# Patient Record
Sex: Female | Born: 1965 | Race: White | Hispanic: No | Marital: Married | Smoking: Never smoker
Health system: Southern US, Community
[De-identification: ages and names within clinical notes are randomized; demographics above are authoritative.]

## PROBLEM LIST (undated history)

## (undated) DIAGNOSIS — I4891 Unspecified atrial fibrillation: Secondary | ICD-10-CM

## (undated) DIAGNOSIS — J302 Other seasonal allergic rhinitis: Secondary | ICD-10-CM

## (undated) DIAGNOSIS — K219 Gastro-esophageal reflux disease without esophagitis: Secondary | ICD-10-CM

## (undated) DIAGNOSIS — D649 Anemia, unspecified: Secondary | ICD-10-CM

## (undated) DIAGNOSIS — M1990 Unspecified osteoarthritis, unspecified site: Secondary | ICD-10-CM

## (undated) DIAGNOSIS — E049 Nontoxic goiter, unspecified: Secondary | ICD-10-CM

## (undated) DIAGNOSIS — R0683 Snoring: Secondary | ICD-10-CM

## (undated) DIAGNOSIS — E7800 Pure hypercholesterolemia, unspecified: Secondary | ICD-10-CM

## (undated) DIAGNOSIS — I10 Essential (primary) hypertension: Secondary | ICD-10-CM

## (undated) DIAGNOSIS — R0600 Dyspnea, unspecified: Secondary | ICD-10-CM

## (undated) HISTORY — PX: BREAST SURGERY: SHX581

## (undated) HISTORY — DX: Snoring: R06.83

## (undated) HISTORY — PX: WISDOM TOOTH EXTRACTION: SHX21

## (undated) HISTORY — PX: AUGMENTATION MAMMAPLASTY: SUR837

## (undated) HISTORY — PX: OTHER SURGICAL HISTORY: SHX169

## (undated) HISTORY — PX: COLONOSCOPY: SHX174

---

## 2012-06-27 ENCOUNTER — Other Ambulatory Visit (HOSPITAL_COMMUNITY)
Admission: RE | Admit: 2012-06-27 | Discharge: 2012-06-27 | Disposition: A | Discharge status: Home / Self Care | Payer: BC Managed Care – PPO | Source: Ambulatory Visit | Attending: Family Medicine | Admitting: Family Medicine

## 2012-06-27 DIAGNOSIS — Z1151 Encounter for screening for human papillomavirus (HPV): Secondary | ICD-10-CM | POA: Insufficient documentation

## 2012-06-27 DIAGNOSIS — Z0000 Encounter for general adult medical examination without abnormal findings: Secondary | ICD-10-CM | POA: Insufficient documentation

## 2012-08-13 ENCOUNTER — Other Ambulatory Visit: Payer: Self-pay

## 2012-08-13 DIAGNOSIS — Z1231 Encounter for screening mammogram for malignant neoplasm of breast: Secondary | ICD-10-CM

## 2012-09-04 ENCOUNTER — Ambulatory Visit
Admission: RE | Admit: 2012-09-04 | Discharge: 2012-09-04 | Disposition: A | Discharge status: Home / Self Care | Payer: BC Managed Care – PPO | Source: Ambulatory Visit

## 2012-09-04 DIAGNOSIS — Z1231 Encounter for screening mammogram for malignant neoplasm of breast: Secondary | ICD-10-CM

## 2012-09-15 ENCOUNTER — Other Ambulatory Visit: Payer: Self-pay | Admitting: Family Medicine

## 2012-09-15 DIAGNOSIS — R928 Other abnormal and inconclusive findings on diagnostic imaging of breast: Secondary | ICD-10-CM

## 2012-09-24 ENCOUNTER — Ambulatory Visit
Admission: RE | Admit: 2012-09-24 | Discharge: 2012-09-24 | Disposition: A | Discharge status: Home / Self Care | Payer: BC Managed Care – PPO | Source: Ambulatory Visit | Attending: Family Medicine | Admitting: Family Medicine

## 2012-09-24 DIAGNOSIS — R928 Other abnormal and inconclusive findings on diagnostic imaging of breast: Secondary | ICD-10-CM

## 2012-10-03 ENCOUNTER — Other Ambulatory Visit: Payer: BC Managed Care – PPO

## 2012-12-10 ENCOUNTER — Encounter (HOSPITAL_COMMUNITY): Payer: Self-pay | Admitting: Emergency Medicine

## 2012-12-10 ENCOUNTER — Emergency Department (HOSPITAL_COMMUNITY)
Admission: EM | Admit: 2012-12-10 | Discharge: 2012-12-10 | Disposition: A | Discharge status: Home / Self Care | Payer: BC Managed Care – PPO | Attending: Emergency Medicine | Admitting: Emergency Medicine

## 2012-12-10 ENCOUNTER — Emergency Department (HOSPITAL_COMMUNITY): Payer: BC Managed Care – PPO

## 2012-12-10 DIAGNOSIS — I4891 Unspecified atrial fibrillation: Secondary | ICD-10-CM

## 2012-12-10 DIAGNOSIS — I480 Paroxysmal atrial fibrillation: Secondary | ICD-10-CM

## 2012-12-10 DIAGNOSIS — Z3202 Encounter for pregnancy test, result negative: Secondary | ICD-10-CM | POA: Insufficient documentation

## 2012-12-10 DIAGNOSIS — R000 Tachycardia, unspecified: Secondary | ICD-10-CM | POA: Insufficient documentation

## 2012-12-10 DIAGNOSIS — Z862 Personal history of diseases of the blood and blood-forming organs and certain disorders involving the immune mechanism: Secondary | ICD-10-CM | POA: Insufficient documentation

## 2012-12-10 DIAGNOSIS — R42 Dizziness and giddiness: Secondary | ICD-10-CM | POA: Insufficient documentation

## 2012-12-10 DIAGNOSIS — R0602 Shortness of breath: Secondary | ICD-10-CM | POA: Insufficient documentation

## 2012-12-10 DIAGNOSIS — Z8639 Personal history of other endocrine, nutritional and metabolic disease: Secondary | ICD-10-CM | POA: Insufficient documentation

## 2012-12-10 DIAGNOSIS — Z8679 Personal history of other diseases of the circulatory system: Secondary | ICD-10-CM | POA: Insufficient documentation

## 2012-12-10 DIAGNOSIS — R110 Nausea: Secondary | ICD-10-CM | POA: Insufficient documentation

## 2012-12-10 HISTORY — DX: Unspecified atrial fibrillation: I48.91

## 2012-12-10 HISTORY — DX: Other seasonal allergic rhinitis: J30.2

## 2012-12-10 HISTORY — DX: Pure hypercholesterolemia, unspecified: E78.00

## 2012-12-10 HISTORY — DX: Anemia, unspecified: D64.9

## 2012-12-10 HISTORY — DX: Essential (primary) hypertension: I10

## 2012-12-10 LAB — POCT I-STAT TROPONIN I: Troponin i, poc: 0 ng/mL (ref 0.00–0.08)

## 2012-12-10 LAB — BASIC METABOLIC PANEL
BUN: 10 mg/dL (ref 6–23)
CO2: 25 mEq/L (ref 19–32)
GFR calc Af Amer: 86 mL/min — ABNORMAL LOW (ref >90)
GFR calc non Af Amer: 74 mL/min — ABNORMAL LOW (ref >90)
Potassium: 4.3 mEq/L (ref 3.5–5.1)
Sodium: 140 mEq/L (ref 135–145)

## 2012-12-10 LAB — MAGNESIUM: Magnesium: 1.9 mg/dL (ref 1.5–2.5)

## 2012-12-10 LAB — TSH: TSH: 1.224 u[IU]/mL (ref 0.350–4.500)

## 2012-12-10 LAB — CBC
HCT: 40.7 % (ref 36.0–46.0)
MCHC: 33.9 g/dL (ref 30.0–36.0)
MCV: 87.9 fL (ref 78.0–100.0)
Platelets: 331 10*3/uL (ref 150–400)
RDW: 12.8 % (ref 11.5–15.5)

## 2012-12-10 LAB — T4, FREE: Free T4: 1.12 ng/dL (ref 0.80–1.80)

## 2012-12-10 MED ORDER — ASPIRIN EC 81 MG PO TBEC: 81.0000 mg | DELAYED_RELEASE_TABLET | Freq: Every day | ORAL | Status: DC

## 2012-12-10 MED ORDER — DILTIAZEM HCL ER COATED BEADS 120 MG PO CP24: 120.0000 mg | ORAL_CAPSULE | Freq: Every day | ORAL | Status: DC

## 2012-12-10 MED ORDER — DILTIAZEM HCL 25 MG/5ML IV SOLN
20.0000 mg | Freq: Once | INTRAVENOUS | Status: AC
  Administered 2012-12-10: 20 mg via INTRAVENOUS
  Filled 2012-12-10: qty 5

## 2012-12-10 MED ORDER — WARFARIN SODIUM 5 MG PO TABS: 5.0000 mg | ORAL_TABLET | Freq: Every day | ORAL | Status: DC

## 2012-12-10 MED ORDER — PERFLUTREN LIPID MICROSPHERE
1.0000 mL | INTRAVENOUS | Status: DC | PRN
  Administered 2012-12-10: 2 mL via INTRAVENOUS
  Filled 2012-12-10: qty 10

## 2012-12-10 NOTE — H&P (Signed)
Cardiology Consultation Note  Patient ID: Elaine [name], MRN: [id number], DOB/AGE: [birth year] 47 y.o. Admit date: 12/10/2012   Date of Consult: 12/10/2012 Primary Physician: No primary provider on file. Primary Cardiologist: New, being seen by Dr.Jaxson Anglin - lives in [place] - saw Dr. Noralyn Pick in 2011 for palpitations  Chief Complaint: palpitations Reason for Consult: atrial fibrillation  HPI: Ms. 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 She was transferred to [facility] for further evaluation where she was given 20mg  IV diltiazem with subsequent conversion to NSR. She had cardiac eval in 2011 at [facility] for intermittent palpitations at which time she reports event monitoring was unremarkable except for a few "blips." CareEverywhere shows a normal echo at that time.  She reports increasing frequency of tachypalps over the last 3-4 months. This morning she woke up, rolled over on her left side, and the palpitations started. This was associated with a feeling of uncomfortableness in her chest but no overt CP. She made an appointment with her PCP and did note that she was more SOB walking up to the doctor's office. She does not exercise, but denies any exertional CP or SOB recently. No nausea, syncope, stroke history, bleeding history, recent long travel or personal history of blood clot in the past. When she converted to NSR she felt well again. CBC/BMET unremarkable. LMP 10/01/12 but patient denies that she could be pregnant and reports that she has been having irregular periods since entering perimenopause this past year. At home, BP typically runs 130's systolic at home but occasional readings of 150-160, diastolic usually running upper 80s-90s. At the MD office and ER today it was consistently elevated up to peak 169/95. It has  come down to 136/90 with diltiazem. Denies orthopnea. Endorses gradual weight gain of 20lbs over 2 yrs she feels due to eating habits, less activity and transition to more sedentary job. No acute weight changes however. Last dose of Imitrex last month. Cut down on caffeine after instructed to in 2011.  Past Medical History  Diagnosis Date  . Migraines   . Hypercholesterolemia     Borderline - not requiring medicines  . Seasonal allergies   . Anemia     In past - borderline anemia requiring iron      Most Recent Cardiac Studies: 2D Echo 08/2009:  INTERPRETATION(S)  1. LVEF >55%.  2. No significant valvular disease.  AORTIC ROOT:  Size: 3.0  Dissection: indeterminate for dissection   LEFT VENTRICLE LV WALL MOTION  Size: normal Normal in limited views  Diastolic Filling: normal  Estimated EF: >55%  LV masses: none  LVH: none  LV dimensions:  IVSd: 0.8 LVIDd: 4.0 LVPWd: 0.8 LVIDs: 2.2   RIGHT VENTRICLE  Size: normal RV free wall: normal  Contraction normal RV masses: none   LEFT ATRIUM  Size: normal Diameter: 3.2  LA Masses: none Area (cm2): 18.3   RIGHT ATRIUM  Size: normal RA other: none  RA Masses: none   PERICARDIUM  Fluid: none   AORTIC VALVE MITRAL VALVE  Leaflets: normal Leaflets: normal  Morphology: tri-leaflet Morphology: normal  Mobility: normal Mobility: normal   TRICUSPID VALVE PULMONIC VALVE  Leaflets: normal Not well seen  Morphology: normal  Mobility: normal   DOPPLER ECHO WITH SPECTRAL AND COLOR FLOW VELOCITY  Regurgitation Stenosis  Aortic none none  Mitral none none  Tricuspid none none TR Velocity = 1.6 m/s  Pulmonic none none Estimated RVSP = 21 mmHg      Surgical History:  Past Surgical History  Procedure Laterality Date  . Breast surgery    . Egg donation      In her 20's.     Home Meds: Prior to Admission medications   Medication Sig Start Date End Date Taking? Authorizing Provider  carboxymethylcellulose (REFRESH PLUS) 0.5  % SOLN Place 2 drops into both eyes 3 (three) times daily as needed.   Yes Historical Provider, MD  cholecalciferol (VITAMIN D) 1000 UNITS tablet Take 1,000 Units by mouth daily.   Yes Historical Provider, MD  ibuprofen (ADVIL,MOTRIN) 200 MG tablet Take 600 mg by mouth daily as needed.   Yes Historical Provider, MD  naproxen (NAPROSYN) 250 MG tablet Take 250 mg by mouth daily as needed for mild pain.   Yes Historical Provider, MD  Omega-3 Fatty Acids (FISH OIL) 1000 MG CAPS Take 2,000 mg by mouth daily.   Yes Historical Provider, MD  OVER THE COUNTER MEDICATION Take 3 tablets by mouth 2 (two) times daily with a meal. Macafem   Yes Historical Provider, MD  SUMAtriptan (IMITREX) 25 MG tablet Take 25 mg by mouth once. May repeat in 2 hours if headache persists or recurs.   Yes Historical Provider, MD    Inpatient Medications:     Allergies:  Allergies  Allergen Reactions  . Other Other (See Comments)    Raw Almonds, itching   Peaches, itching  . Ampicillin Rash    Has taken amoxicillin without any reaction    History   Social History  . Marital Status: Divorced    Spouse Name: N/A    Number of Children: N/A  . Years of Education: N/A   Occupational History  .      Nurse   Social History Main Topics  . Smoking status: Never Smoker   . Smokeless tobacco: Not on file     Comment: Secondhand smoke up to age 36  . Alcohol Use: Yes     Comment: Occasional glass of wine on weekend or with dinner  . Drug Use: No  . Sexual Activity: Not on file   Other Topics Concern  . Not on file   Social History Narrative  . No narrative on file     Family History  Problem Relation Age of Onset  . Atrial fibrillation Father   . Atrial fibrillation      Aunt  . Stroke Mother   . CAD      Paternal uncle CABG age 26  . Migraines Sister   . Aneurysm Mother     Also had blood clot after sister was born  . Peripheral vascular disease Mother     In her 72's     Review of  Systems: General: negative for chills, fever, Cardiovascular: see above- occ mild LEE around time of menstrual cycle or eating salt Dermatological: negative for rash Respiratory: negative for cough or wheezing Urologic: negative for hematuria Abdominal: negative for nausea, vomiting, diarrhea, bright red blood per rectum, melena, or hematemesis Neurologic: negative for visual changes, syncope, or dizziness All other systems reviewed and are otherwise negative except as noted above.  Labs: Troponin neg x 1 Lab Results  Component Value Date   WBC 7.3 12/10/2012   HGB 13.8 12/10/2012   HCT 40.7 12/10/2012   MCV 87.9 12/10/2012   PLT 331 12/10/2012     Recent  Labs Lab 12/10/12 1136  NA 140  K 4.3  CL 105  CO2 25  BUN 10  CREATININE 0.91  CALCIUM 9.7  GLUCOSE 89   Radiology/Studies:  Dg Chest 2 View 12/10/2012   CLINICAL DATA:  Atrial fibrillation this morning. No other complaints.  EXAM: CHEST  2 VIEW  COMPARISON:  None.  FINDINGS: The heart size and mediastinal contours are within normal limits. Both lungs are clear. The visualized skeletal structures are unremarkable.  IMPRESSION: No active cardiopulmonary disease.   Electronically Signed   By: Elige Ko   On: 12/10/2012 12:03   EKG: atrial fibrillation RVR 130bpm, nonspecific ST-T changes  Physical Exam: Blood pressure 136/90, temperature 98.3 F (36.8 C), temperature source Oral, resp. rate 17, height 5\' 4"  (1.626 m), weight 193 lb (87.544 kg), last menstrual period 10/01/2012, SpO2 99.00%. General: Well developed, well appearing WF in no acute distress. Head: Normocephalic, atraumatic, sclera non-icteric, no xanthomas, nares are without discharge.  Neck: Negative for carotid bruits. JVD not elevated. Lungs: Clear bilaterally to auscultation without wheezes, rales, or rhonchi. Breathing is unlabored. Heart: RRR with S1 S2. No murmurs, rubs, or gallops appreciated. Abdomen: Soft, non-tender, non-distended with  normoactive bowel sounds. No hepatomegaly. No rebound/guarding. No obvious abdominal masses. Msk:  Strength and tone appear normal for age. Extremities: No clubbing or cyanosis. No edema.  Distal pedal pulses are 2+ and equal bilaterally. Neuro: Alert and oriented X 3. No facial asymmetry. No focal deficit. Moves all extremities spontaneously. Psych:  Responds to questions appropriately with a normal affect.   Assessment and Plan:   1. Atrial fibrillation, suspected paroxysmal 2. Borderline elevated BP, likely mild hypertension 3. H/o migraines, on PRN imitrex 4. Perimenopause  CHADSVASC score is felt to be 2 for female and also mild HTN, thus she should be considered for anticoagulation. It would be devastating in this otherwise healthy woman to sustain a stroke in the absence of any contraindications to blood thinners. Per discussion with the patient and Dr. Patty Sermons, will begin Coumadin 5mg  daily with INR check in our office on Friday. She does not want to be on a NOAC at this time due to lack of reversiblility. Start Diltiazem CD 120mg  daily. I e-prescribed these. Dr. Patty Sermons would like her to take Aspirin 81mg  daily until INR is therapeutic. She was also given a Coumadin handout attachment to her DC paperwork.  Check urine hCG for completeness since we are beginning Coumadin.  Will send off TSH, free T4, magnesium. She does not need to wait for these results. Echo will be done in ER prior to discharge.  Signed, Ronie Spies PA-C 12/10/2012, 2:22 PM Seen in ER with Ronie Spies, PA-C.  EKG from earlier today reviewed and shows atrial fibrillation with rapid ventricular response. Her CHADSVASC score is 2 for hypertension and female sex. She prefers warfarin to NOAC and will be able to follow up in [facility] coumadin clinic. Her physical exam is unrevealing. Agree with assessment and plan as noted above. Will try to get echo done while she is here in ER. Will add diltiazem CD 120 daily for BP  and future rate control.

## 2012-12-10 NOTE — ED Provider Notes (Signed)
CSN: [id number]     Arrival date & time 12/10/12  1051 History   First MD Initiated Contact with Patient 12/10/12 1052     Chief Complaint  Patient presents with  . Atrial Fibrillation   (Consider location/radiation/quality/duration/timing/severity/associated sxs/prior Treatment) HPI Comments: Patient is a 47 year old female with past medical history of migraines and high cholesterol who presents to the emergency department via EMS from her primary care physician's office with new onset atrial fibrillation. Patient states she woke up this morning and felt as if her heart was racing and "skipping beats" with associated dizziness and shortness of breath when she walks down the stairs. States it feels as if she has anxiety, however without the anxiousness. At the time she was slightly nauseated. She went to her PCP, had an EKG which showed atrial fibrillation and RVR. States she has had episodes like this in the past, however symptoms have not lasted as long as today. 4 years ago she saw a cardiologist at [facility], wore a 24-hour Holter monitor and had an echo which were both normal. She has not seen a cardiologist since. Denies chest pain. EKG from EMS showed atrial fibrillation with a rate of 130.   Patient is a 47 y.o. female presenting with atrial fibrillation. The history is provided by the patient.  Atrial Fibrillation Associated symptoms include nausea.    Past Medical History  Diagnosis Date  . Migraines   . Hypercholesterolemia    Past Surgical History  Procedure Laterality Date  . Breast surgery     No family history on file. History  Substance Use Topics  . Smoking status: Never Smoker   . Smokeless tobacco: Not on file  . Alcohol Use: No   OB History   Grav Para Term Preterm Abortions TAB SAB Ect Mult Living                 Review of Systems  Cardiovascular: Positive for palpitations.  Gastrointestinal: Positive for nausea.  Neurological: Positive for dizziness.     Allergies  Review of patient's allergies indicates not on file.  Home Medications  No current outpatient prescriptions on file. BP 169/95  Temp(Src) 98.2 F (36.8 C) (Oral)  Resp 20  Ht 5\' 4"  (1.626 m)  Wt 193 lb (87.544 kg)  BMI 33.11 kg/m2  SpO2 98%  LMP 10/01/2012 Physical Exam  Nursing note and vitals reviewed. Constitutional: She is oriented to person, place, and time. She appears well-developed and well-nourished. No distress.  HENT:  Head: Normocephalic and atraumatic.  Mouth/Throat: Oropharynx is clear and moist.  Eyes: Conjunctivae and EOM are normal. Pupils are equal, round, and reactive to light.  Neck: Normal range of motion. Neck supple.  Cardiovascular: Normal heart sounds and intact distal pulses.  An irregularly irregular rhythm present. Tachycardia present.   Pulmonary/Chest: Effort normal and breath sounds normal.  Abdominal: Soft. Bowel sounds are normal. There is no tenderness.  Musculoskeletal: Normal range of motion. She exhibits no edema.  Neurological: She is alert and oriented to person, place, and time. She has normal strength. No sensory deficit.  Skin: Skin is warm and dry. She is not diaphoretic.  Psychiatric: She has a normal mood and affect. Her behavior is normal.    ED Course  Procedures (including critical care time) Labs Review Labs Reviewed  CBC  BASIC METABOLIC PANEL   Imaging Review No results found.  EKG Interpretation   None      Date: 12/10/2012  Rate: 130  Rhythm: atrial  fibrillation  QRS Axis: normal  Intervals: normal  ST/T Wave abnormalities: normal  Conduction Disutrbances:none  Narrative Interpretation: afib, anterior infarct (old)  Old EKG Reviewed: none available    MDM   1. PAF (paroxysmal atrial fibrillation)     Patient with new onset atrial fibrillation. She is well appearing and in no apparent distress. Tachycardic with an irregularly irregular rhythm on exam. EKG showing atrial fibrillation.  Will give Cardizem. Labs pending- cbc, bmp, troponin. CXR. Plan to consult cardiology. 11:57 AM Patient now in sinus rhythm with rate of 94. Her symptoms have resolved. 2:56 PM I spoke with Ward Givens, NP with cardiology who had PA come down to see patient. She spoke with her attending Dr. Patty Sermons who suggested starting Warfarin and Diltiazem, obtaining echo in ED. Medications prescribed by cards PA. Echo pending, once complete, d/c home.  Pt to be discharged home after echo by Dr. Manus Gunning.  Trevor Mace, PA-C 12/11/12 [phone]

## 2012-12-10 NOTE — Progress Notes (Signed)
The patient was given the following info prior to leaving ED:  Elaine [name] 12/10/12  Coumadin clinic appointment: Friday 12/12/12 at 2:30pm Followup with Dr. Patty Sermons: 12/19/12 at 3:30pm [facility] [address] [place], [place] [address]   New medicines: Diltiazem CD 120mg  by mouth once daily Coumadin 5mg  by mouth once daily for now (Coumadin clinic will tell you if your dose needs adjusting) Take Aspirin 81mg  by mouth daily until INR is therapeutic between 2-3, then discontinue aspirin

## 2012-12-10 NOTE — Progress Notes (Signed)
Echocardiogram 2D Echocardiogram with Definity has been performed.  Riordan Walle 12/10/2012, 4:20 PM

## 2012-12-10 NOTE — ED Notes (Signed)
Pt woke up this am and her heart was skipping beats.  Mild dyspnea with exertion.  Dizziness and nausea mild this am.  PCP did a EKG and revealed RVR with A Fib.  Last EKG strip for EMS read A Fib, rate of 130.  Pt alert and oriented.  Walked from EMS stretcher to bed in ED.

## 2012-12-11 NOTE — ED Provider Notes (Addendum)
Medical screening examination/treatment/procedure(s) were performed by non-physician practitioner and as supervising physician I was immediately available for consultation/collaboration.  EKG Interpretation    Date/Time:  Wednesday December 10 2012 11:00:39 EST Ventricular Rate:  130 PR Interval:    QRS Duration: 81 QT Interval:  300 QTC Calculation: 441 R Axis:   63 Text Interpretation:  Atrial fibrillation Anterior infarct, old ED PHYSICIAN INTERPRETATION AVAILABLE IN [facility] Confirmed by TEST, RECORD ([id number]) on 12/12/2012 9:12:26 AM               Layla Maw Bexley Mclester, DO 12/11/12 0708  Layla Maw Ryo Klang, DO 12/17/12 [id number]

## 2012-12-12 ENCOUNTER — Ambulatory Visit (INDEPENDENT_AMBULATORY_CARE_PROVIDER_SITE_OTHER): Payer: BC Managed Care – PPO | Admitting: Pharmacist

## 2012-12-12 DIAGNOSIS — Z7901 Long term (current) use of anticoagulants: Secondary | ICD-10-CM

## 2012-12-12 DIAGNOSIS — I4891 Unspecified atrial fibrillation: Secondary | ICD-10-CM

## 2012-12-12 LAB — POCT INR: INR: 1

## 2012-12-17 ENCOUNTER — Encounter: Payer: Self-pay | Admitting: *Deleted

## 2012-12-19 ENCOUNTER — Encounter: Payer: Self-pay | Admitting: Cardiology

## 2012-12-19 ENCOUNTER — Ambulatory Visit (INDEPENDENT_AMBULATORY_CARE_PROVIDER_SITE_OTHER): Payer: BC Managed Care – PPO | Admitting: Cardiology

## 2012-12-19 VITALS — BP 132/88 | HR 69 | Ht 64.0 in | Wt 195.0 lb

## 2012-12-19 DIAGNOSIS — I119 Hypertensive heart disease without heart failure: Secondary | ICD-10-CM | POA: Insufficient documentation

## 2012-12-19 DIAGNOSIS — I4891 Unspecified atrial fibrillation: Secondary | ICD-10-CM

## 2012-12-19 DIAGNOSIS — Z7901 Long term (current) use of anticoagulants: Secondary | ICD-10-CM

## 2012-12-19 MED ORDER — DILTIAZEM HCL ER COATED BEADS 120 MG PO CP24: 120.0000 mg | ORAL_CAPSULE | Freq: Every day | ORAL | Status: DC

## 2012-12-19 NOTE — Assessment & Plan Note (Signed)
No further episodes of atrial fibrillation.

## 2012-12-19 NOTE — Addendum Note (Signed)
Addended by: Regis Bill B on: 12/19/2012 05:19 PM   Modules accepted: Orders

## 2012-12-19 NOTE — Progress Notes (Signed)
Elaine [name] Date of Birth:  07-16-1965 [address] [place], [place]  [address] [phone]         Fax   [phone]  History of Present Illness: This pleasant 47 year old woman returns for a post hospital office visit.  She had been seen in [facility] emergency room last week with paroxysmal atrial fibrillation.  She converted on IV diltiazem.  She gave a past history of tachycardia palpitations.  She had an evaluation at [facility] in 2001 for intermittent palpitations.  Her echocardiogram at that time was normal. The patient was seen in the emergency room at [facility] on 12/10/12.  She was noted to be mildly hypertensive and her Chadsvasc score was 2 for hypertension and female sex.  She was started on warfarin.  She preferred warfarin to one of the NOAC drugs because of its reversibility. She had an echocardiogram done prior to leaving the emergency room on 12/10/12 and the echocardiogram was normal with left ventricular ejection fraction of 60-65% and no valvular lesions.  She was discharged from the emergency room on diltiazem CD 120 once a day and on Coumadin. Since discharge she has had one or 2 very brief episodes of palpitations but nothing sustained.  No chest pain or shortness of breath.  No migraine headaches.  Current Outpatient Prescriptions  Medication Sig Dispense Refill  . aspirin EC 81 MG tablet Take 1 tablet (81 mg total) by mouth daily. Until INR is therapeutic between 2-3.      . carboxymethylcellulose (REFRESH PLUS) 0.5 % SOLN Place 2 drops into both eyes 3 (three) times daily as needed.      . cholecalciferol (VITAMIN D) 1000 UNITS tablet Take 1,000 Units by mouth daily.      Marland Kitchen diltiazem (CARDIZEM CD) 120 MG 24 hr capsule Take 1 capsule (120 mg total) by mouth daily.  30 capsule  3  . Flaxseed, Linseed, (FLAX SEEDS) POWD Take by mouth as directed.      . Multiple Vitamins-Minerals (CENTRUM ULTRA WOMENS) TABS Take by mouth as directed.      .  Omega-3 Fatty Acids (FISH OIL) 1000 MG CAPS Take 2,000 mg by mouth daily.      Marland Kitchen OVER THE COUNTER MEDICATION Take by mouth. Macafem      . SUMAtriptan (IMITREX) 25 MG tablet Take 25 mg by mouth once. May repeat in 2 hours if headache persists or recurs.      . warfarin (COUMADIN) 5 MG tablet Take 1 tablet (5 mg total) by mouth daily. Or as directed.  60 tablet  3   No current facility-administered medications for this visit.    Allergies  Allergen Reactions  . Other Other (See Comments)    Raw Almonds, itching   Peaches, itching  . Ampicillin Rash    Has taken amoxicillin without any reaction    Patient Active Problem List   Diagnosis Date Noted  . Atrial fibrillation 12/12/2012  . Long term (current) use of anticoagulants 12/12/2012    History  Smoking status  . Never Smoker   Smokeless tobacco  . Not on file    Comment: Secondhand smoke up to age 48    History  Alcohol Use  . Yes    Comment: Occasional glass of wine on weekend or with dinner    Family History  Problem Relation Age of Onset  . Atrial fibrillation Father   . Atrial fibrillation      Aunt  .  Stroke Mother   . CAD Paternal Uncle     CABG age 47  . Migraines Sister   . Aneurysm Mother     Also had blood clot after sister was born  . Peripheral vascular disease Mother     In her 68's    Review of Systems: Constitutional: no fever chills diaphoresis or fatigue or change in weight.  Head and neck: no hearing loss, no epistaxis, no photophobia or visual disturbance. Respiratory: No cough, shortness of breath or wheezing. Cardiovascular: No chest pain peripheral edema, palpitations. Gastrointestinal: No abdominal distention, no abdominal pain, no change in bowel habits hematochezia or melena. Genitourinary: No dysuria, no frequency, no urgency, no nocturia. Musculoskeletal:No arthralgias, no back pain, no gait disturbance or myalgias. Neurological: No dizziness, no headaches, no numbness, no  seizures, no syncope, no weakness, no tremors. Hematologic: No lymphadenopathy, no easy bruising. Psychiatric: No confusion, no hallucinations, no sleep disturbance.    Physical Exam: Filed Vitals:   12/19/12 1527  BP: 132/88  Pulse: 69   the general appearance reveals a well-developed well-nourished woman in no distress.  Her husband is with her today.The head and neck exam reveals pupils equal and reactive.  Extraocular movements are full.  There is no scleral icterus.  The mouth and pharynx are normal.  The neck is supple.  The carotids reveal no bruits.  The jugular venous pressure is normal.  The  thyroid is not enlarged.  There is no lymphadenopathy.  The chest is clear to percussion and auscultation.  There are no rales or rhonchi.  Expansion of the chest is symmetrical.  The precordium is quiet.  The first heart sound is normal.  The second heart sound is physiologically split.  There is no murmur gallop rub or click.  There is no abnormal lift or heave.  The abdomen is soft and nontender.  The bowel sounds are normal.  The liver and spleen are not enlarged.  There are no abdominal masses.  There are no abdominal bruits.  Extremities reveal good pedal pulses.  There is no phlebitis or edema.  There is no cyanosis or clubbing.  Strength is normal and symmetrical in all extremities.  There is no lateralizing weakness.  There are no sensory deficits.  The skin is warm and dry.  There is no rash.  EKG today shows normal sinus rhythm and no ischemic changes.  Assessment / Plan: Continue on same medication.  Recheck in 3 months for followup office visit and EKG.  Work harder on weight loss which will help diastolic blood pressure.

## 2012-12-19 NOTE — Assessment & Plan Note (Signed)
Diastolic blood pressure today is still borderline high.  We will continue same dose of diltiazem but she will work harder on weight loss and salt restriction.  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

## 2012-12-19 NOTE — Assessment & Plan Note (Signed)
The Coumadin clinic was closed this afternoon so we drew a prothrombin time through venous blood and we will call her with the results.

## 2012-12-19 NOTE — Patient Instructions (Signed)
Will obtain labs today and call you with the results (PT/INR)  Your physician recommends that you continue on your current medications as directed. Please refer to the Current Medication list given to you today.  Your physician recommends that you schedule a follow-up appointment in: 3 month ov/ekg

## 2012-12-22 ENCOUNTER — Ambulatory Visit (INDEPENDENT_AMBULATORY_CARE_PROVIDER_SITE_OTHER): Payer: BC Managed Care – PPO | Admitting: Internal Medicine

## 2012-12-22 DIAGNOSIS — I4891 Unspecified atrial fibrillation: Secondary | ICD-10-CM

## 2012-12-22 DIAGNOSIS — Z7901 Long term (current) use of anticoagulants: Secondary | ICD-10-CM

## 2012-12-26 ENCOUNTER — Ambulatory Visit (INDEPENDENT_AMBULATORY_CARE_PROVIDER_SITE_OTHER): Payer: BC Managed Care – PPO | Admitting: *Deleted

## 2012-12-26 DIAGNOSIS — Z7901 Long term (current) use of anticoagulants: Secondary | ICD-10-CM

## 2012-12-26 DIAGNOSIS — I4891 Unspecified atrial fibrillation: Secondary | ICD-10-CM

## 2012-12-26 LAB — POCT INR: INR: 1.3

## 2012-12-31 ENCOUNTER — Ambulatory Visit (INDEPENDENT_AMBULATORY_CARE_PROVIDER_SITE_OTHER): Payer: BC Managed Care – PPO | Admitting: *Deleted

## 2012-12-31 DIAGNOSIS — I4891 Unspecified atrial fibrillation: Secondary | ICD-10-CM

## 2012-12-31 DIAGNOSIS — Z7901 Long term (current) use of anticoagulants: Secondary | ICD-10-CM

## 2012-12-31 LAB — POCT INR: INR: 1.5

## 2013-01-07 ENCOUNTER — Ambulatory Visit (INDEPENDENT_AMBULATORY_CARE_PROVIDER_SITE_OTHER): Payer: BC Managed Care – PPO | Admitting: *Deleted

## 2013-01-07 DIAGNOSIS — Z7901 Long term (current) use of anticoagulants: Secondary | ICD-10-CM

## 2013-01-07 DIAGNOSIS — I4891 Unspecified atrial fibrillation: Secondary | ICD-10-CM

## 2013-01-21 ENCOUNTER — Ambulatory Visit (INDEPENDENT_AMBULATORY_CARE_PROVIDER_SITE_OTHER): Payer: BC Managed Care – PPO | Admitting: Pharmacist

## 2013-01-21 DIAGNOSIS — Z7901 Long term (current) use of anticoagulants: Secondary | ICD-10-CM

## 2013-01-21 DIAGNOSIS — I4891 Unspecified atrial fibrillation: Secondary | ICD-10-CM

## 2013-02-11 ENCOUNTER — Ambulatory Visit (INDEPENDENT_AMBULATORY_CARE_PROVIDER_SITE_OTHER): Payer: BC Managed Care – PPO | Admitting: *Deleted

## 2013-02-11 DIAGNOSIS — Z7901 Long term (current) use of anticoagulants: Secondary | ICD-10-CM

## 2013-02-11 DIAGNOSIS — I4891 Unspecified atrial fibrillation: Secondary | ICD-10-CM

## 2013-02-11 LAB — POCT INR: INR: 1.8

## 2013-02-25 ENCOUNTER — Ambulatory Visit (INDEPENDENT_AMBULATORY_CARE_PROVIDER_SITE_OTHER): Payer: BC Managed Care – PPO | Admitting: *Deleted

## 2013-02-25 DIAGNOSIS — Z7901 Long term (current) use of anticoagulants: Secondary | ICD-10-CM

## 2013-02-25 DIAGNOSIS — Z5181 Encounter for therapeutic drug level monitoring: Secondary | ICD-10-CM | POA: Insufficient documentation

## 2013-02-25 DIAGNOSIS — I4891 Unspecified atrial fibrillation: Secondary | ICD-10-CM

## 2013-02-25 LAB — POCT INR: INR: 2

## 2013-03-18 ENCOUNTER — Ambulatory Visit: Payer: BC Managed Care – PPO | Admitting: Cardiology

## 2013-03-26 ENCOUNTER — Other Ambulatory Visit: Payer: Self-pay | Admitting: *Deleted

## 2013-03-26 ENCOUNTER — Telehealth: Payer: Self-pay | Admitting: *Deleted

## 2013-03-26 DIAGNOSIS — I4891 Unspecified atrial fibrillation: Secondary | ICD-10-CM

## 2013-03-26 MED ORDER — DILTIAZEM HCL ER COATED BEADS 120 MG PO CP24: 120.0000 mg | ORAL_CAPSULE | Freq: Every day | ORAL | Status: DC

## 2013-03-26 MED ORDER — WARFARIN SODIUM 5 MG PO TABS: ORAL_TABLET | ORAL | Status: DC

## 2013-03-26 NOTE — Telephone Encounter (Signed)
Patient requests coumadin refill to be sent to [facility] on [address]. Thanks, Elaine [name]

## 2013-03-30 ENCOUNTER — Other Ambulatory Visit (HOSPITAL_COMMUNITY): Payer: Self-pay | Admitting: Physician Assistant

## 2013-04-07 ENCOUNTER — Ambulatory Visit (INDEPENDENT_AMBULATORY_CARE_PROVIDER_SITE_OTHER): Payer: BC Managed Care – PPO

## 2013-04-07 DIAGNOSIS — Z5181 Encounter for therapeutic drug level monitoring: Secondary | ICD-10-CM

## 2013-04-07 DIAGNOSIS — Z7901 Long term (current) use of anticoagulants: Secondary | ICD-10-CM

## 2013-04-07 DIAGNOSIS — I4891 Unspecified atrial fibrillation: Secondary | ICD-10-CM

## 2013-04-07 LAB — POCT INR: INR: 2

## 2013-04-07 MED ORDER — WARFARIN SODIUM 5 MG PO TABS: ORAL_TABLET | ORAL | Status: DC

## 2013-05-08 ENCOUNTER — Encounter: Payer: Self-pay | Admitting: Cardiology

## 2013-05-08 ENCOUNTER — Ambulatory Visit (INDEPENDENT_AMBULATORY_CARE_PROVIDER_SITE_OTHER): Payer: No Typology Code available for payment source | Admitting: Pharmacist

## 2013-05-08 ENCOUNTER — Ambulatory Visit (INDEPENDENT_AMBULATORY_CARE_PROVIDER_SITE_OTHER): Payer: No Typology Code available for payment source | Admitting: Cardiology

## 2013-05-08 VITALS — BP 123/76 | HR 76 | Ht 64.0 in | Wt 188.0 lb

## 2013-05-08 DIAGNOSIS — I4891 Unspecified atrial fibrillation: Secondary | ICD-10-CM

## 2013-05-08 DIAGNOSIS — I119 Hypertensive heart disease without heart failure: Secondary | ICD-10-CM

## 2013-05-08 DIAGNOSIS — I480 Paroxysmal atrial fibrillation: Secondary | ICD-10-CM

## 2013-05-08 DIAGNOSIS — Z5181 Encounter for therapeutic drug level monitoring: Secondary | ICD-10-CM

## 2013-05-08 DIAGNOSIS — Z7901 Long term (current) use of anticoagulants: Secondary | ICD-10-CM

## 2013-05-08 LAB — POCT INR: INR: 1.9

## 2013-05-08 NOTE — Patient Instructions (Signed)
Your physician recommends that you continue on your current medications as directed. Please refer to the Current Medication list given to you today.  Your physician wants you to follow-up in: 4 months with fasting labs (lp/bmet/hfp) You will receive a reminder letter in the mail two months in advance. If you don't receive a letter, please call our office to schedule the follow-up appointment.  

## 2013-05-08 NOTE — Progress Notes (Signed)
Elaine [name] Date of Birth:  [birth year] [address] [place], [place]  [address] [phone]         Fax   [phone]  History of Present Illness: This pleasant 48 year old woman returns for a scheduled followup office visit.  She had been seen in [facility] emergency room in November 2014 with paroxysmal atrial fibrillation.  She converted on IV diltiazem.  She gave a past history of tachycardia palpitations.  She had an evaluation at [facility] in 2001 for intermittent palpitations.  Her echocardiogram at that time was normal. The patient was seen in the emergency room at [facility] on 12/10/12.  She was noted to be mildly hypertensive and her Chadsvasc score was 2 for hypertension and female sex.  She was started on warfarin.  She preferred warfarin to one of the NOAC drugs because of its reversibility. She had an echocardiogram done prior to leaving the emergency room on 12/10/12 and the echocardiogram was normal with left ventricular ejection fraction of 60-65% and no valvular lesions.  She was discharged from the emergency room on diltiazem CD 120 once a day and on Coumadin. Since discharge she has had one or 2 very brief episodes of palpitations but nothing sustained.  No chest pain or shortness of breath.  No migraine headaches.  She has started to have some perimenopausal symptoms of hot flashes and night sweats.  She has found that she cannot drink red wine without having palpitations.  However she is able to drink a small amount of white wine.  The patient has been walking at lunch for 45 minutes 4 times a week and her weight is down 7 pounds since last visit.  Current Outpatient Prescriptions  Medication Sig Dispense Refill  . carboxymethylcellulose (REFRESH PLUS) 0.5 % SOLN Place 2 drops into both eyes 3 (three) times daily as needed.      . cholecalciferol (VITAMIN D) 1000 UNITS tablet Take 1,000 Units by mouth daily.      Marland Kitchen diltiazem (CARDIZEM CD) 120 MG  24 hr capsule Take 1 capsule (120 mg total) by mouth daily.  30 capsule  1  . Flaxseed, Linseed, (FLAX SEEDS) POWD Take by mouth as directed.      . Multiple Vitamins-Minerals (CENTRUM ULTRA WOMENS) TABS Take by mouth as directed.      . Omega-3 Fatty Acids (FISH OIL) 1000 MG CAPS Take 2,000 mg by mouth daily.      Marland Kitchen OVER THE COUNTER MEDICATION Take by mouth. Macafem      . SUMAtriptan (IMITREX) 25 MG tablet Take 25 mg by mouth once. May repeat in 2 hours if headache persists or recurs.      . warfarin (COUMADIN) 5 MG tablet Take as directed by coumadin clinic  60 tablet  3   No current facility-administered medications for this visit.    Allergies  Allergen Reactions  . Other Other (See Comments)    Raw Almonds, itching   Peaches, itching  . Ampicillin Rash    Has taken amoxicillin without any reaction    Patient Active Problem List   Diagnosis Date Noted  . Encounter for therapeutic drug monitoring 02/25/2013  . Benign hypertensive heart disease without heart failure 12/19/2012  . Atrial fibrillation 12/12/2012  . Long term (current) use of anticoagulants 12/12/2012    History  Smoking status  . Never Smoker   Smokeless tobacco  . Not on file    Comment: Secondhand smoke  up to age 19    History  Alcohol Use  . Yes    Comment: Occasional glass of wine on weekend or with dinner    Family History  Problem Relation Age of Onset  . Atrial fibrillation Father   . Atrial fibrillation      Aunt  . Stroke Mother   . CAD Paternal Uncle     CABG age 45  . Migraines Sister   . Aneurysm Mother     Also had blood clot after sister was born  . Peripheral vascular disease Mother     In her 38's    Review of Systems: Constitutional: no fever chills diaphoresis or fatigue or change in weight.  Head and neck: no hearing loss, no epistaxis, no photophobia or visual disturbance. Respiratory: No cough, shortness of breath or wheezing. Cardiovascular: No chest pain peripheral  edema, palpitations. Gastrointestinal: No abdominal distention, no abdominal pain, no change in bowel habits hematochezia or melena. Genitourinary: No dysuria, no frequency, no urgency, no nocturia. Musculoskeletal:No arthralgias, no back pain, no gait disturbance or myalgias. Neurological: No dizziness, no headaches, no numbness, no seizures, no syncope, no weakness, no tremors. Hematologic: No lymphadenopathy, no easy bruising. Psychiatric: No confusion, no hallucinations, no sleep disturbance.    Physical Exam: Filed Vitals:   05/08/13 1525  BP: 123/76  Pulse: 76   the general appearance reveals a well-developed well-nourished woman in no distress.  Her husband is with her today.The head and neck exam reveals pupils equal and reactive.  Extraocular movements are full.  There is no scleral icterus.  The mouth and pharynx are normal.  The neck is supple.  The carotids reveal no bruits.  The jugular venous pressure is normal.  The  thyroid is not enlarged.  There is no lymphadenopathy.  The chest is clear to percussion and auscultation.  There are no rales or rhonchi.  Expansion of the chest is symmetrical.  The precordium is quiet.  The first heart sound is normal.  The second heart sound is physiologically split.  There is no murmur gallop rub or click.  There is no abnormal lift or heave.  The abdomen is soft and nontender.  The bowel sounds are normal.  The liver and spleen are not enlarged.  There are no abdominal masses.  There are no abdominal bruits.  Extremities reveal good pedal pulses.  There is no phlebitis or edema.  There is no cyanosis or clubbing.  Strength is normal and symmetrical in all extremities.  There is no lateralizing weakness.  There are no sensory deficits.  The skin is warm and dry.  There is no rash.  EKG today shows normal sinus rhythm and no ischemic changes.  Assessment / Plan: Continue on same medication.  Recheck in 4 months for followup office visit and EKG.  continue regular walking exercise and continue weight loss efforts.

## 2013-05-08 NOTE — Assessment & Plan Note (Signed)
Blood pressure has been remaining stable on low dose diltiazem CD 120 mg daily.  Interestingly since being placed on diltiazem the frequency and intensity of her migraine headaches has improved as well

## 2013-05-08 NOTE — Assessment & Plan Note (Signed)
The patient has not been aware of any recurrent atrial fibrillation.  She's had no TIA symptoms

## 2013-05-12 ENCOUNTER — Other Ambulatory Visit: Payer: Self-pay | Admitting: *Deleted

## 2013-05-12 DIAGNOSIS — I4891 Unspecified atrial fibrillation: Secondary | ICD-10-CM

## 2013-05-12 MED ORDER — DILTIAZEM HCL ER COATED BEADS 120 MG PO CP24: 120.0000 mg | ORAL_CAPSULE | Freq: Every day | ORAL | Status: DC

## 2013-05-25 ENCOUNTER — Ambulatory Visit (INDEPENDENT_AMBULATORY_CARE_PROVIDER_SITE_OTHER): Payer: No Typology Code available for payment source | Admitting: Pharmacist

## 2013-05-25 DIAGNOSIS — Z7901 Long term (current) use of anticoagulants: Secondary | ICD-10-CM

## 2013-05-25 DIAGNOSIS — I4891 Unspecified atrial fibrillation: Secondary | ICD-10-CM

## 2013-05-25 DIAGNOSIS — Z5181 Encounter for therapeutic drug level monitoring: Secondary | ICD-10-CM

## 2013-05-25 LAB — POCT INR: INR: 1.8

## 2013-06-03 ENCOUNTER — Other Ambulatory Visit: Payer: Self-pay | Admitting: Cardiology

## 2013-06-08 ENCOUNTER — Ambulatory Visit (INDEPENDENT_AMBULATORY_CARE_PROVIDER_SITE_OTHER): Payer: No Typology Code available for payment source | Admitting: Surgery

## 2013-06-08 DIAGNOSIS — Z5181 Encounter for therapeutic drug level monitoring: Secondary | ICD-10-CM

## 2013-06-08 DIAGNOSIS — Z7901 Long term (current) use of anticoagulants: Secondary | ICD-10-CM

## 2013-06-08 DIAGNOSIS — I4891 Unspecified atrial fibrillation: Secondary | ICD-10-CM

## 2013-06-08 LAB — POCT INR: INR: 3.1

## 2013-06-29 ENCOUNTER — Ambulatory Visit (INDEPENDENT_AMBULATORY_CARE_PROVIDER_SITE_OTHER): Payer: No Typology Code available for payment source

## 2013-06-29 DIAGNOSIS — Z7901 Long term (current) use of anticoagulants: Secondary | ICD-10-CM

## 2013-06-29 DIAGNOSIS — Z5181 Encounter for therapeutic drug level monitoring: Secondary | ICD-10-CM

## 2013-06-29 DIAGNOSIS — I4891 Unspecified atrial fibrillation: Secondary | ICD-10-CM

## 2013-06-29 LAB — POCT INR: INR: 2.2

## 2013-07-27 ENCOUNTER — Ambulatory Visit (INDEPENDENT_AMBULATORY_CARE_PROVIDER_SITE_OTHER): Payer: No Typology Code available for payment source | Admitting: *Deleted

## 2013-07-27 DIAGNOSIS — Z7901 Long term (current) use of anticoagulants: Secondary | ICD-10-CM

## 2013-07-27 DIAGNOSIS — Z5181 Encounter for therapeutic drug level monitoring: Secondary | ICD-10-CM

## 2013-07-27 DIAGNOSIS — I4891 Unspecified atrial fibrillation: Secondary | ICD-10-CM

## 2013-07-27 LAB — POCT INR: INR: 3.1

## 2013-07-29 ENCOUNTER — Other Ambulatory Visit: Payer: Self-pay | Admitting: Cardiology

## 2013-07-29 ENCOUNTER — Telehealth: Payer: Self-pay | Admitting: Cardiology

## 2013-07-29 MED ORDER — DILTIAZEM HCL ER COATED BEADS 240 MG PO CP24: 240.0000 mg | ORAL_CAPSULE | Freq: Every day | ORAL | Status: DC

## 2013-07-29 NOTE — Telephone Encounter (Signed)
New message     Talk to a nurse about diltiazem and maybe needing the dosage adjusted.

## 2013-07-29 NOTE — Telephone Encounter (Signed)
Advised patient and sent new Rx to pharmacy

## 2013-07-29 NOTE — Telephone Encounter (Signed)
Increase the dose of diltiazem CD to 240 mg daily

## 2013-07-29 NOTE — Telephone Encounter (Signed)
Spoke with patient and her blood pressure has been running up to 130's/80's and heart rate not below 82. Patient aware this is not high but her headaches have come back and she is feeling more tired. Patient concerned Diltiazem needs to be increased, taking 120 mg daily. Will forward to  Dr. Mare Ferrari for review

## 2013-08-10 ENCOUNTER — Ambulatory Visit (INDEPENDENT_AMBULATORY_CARE_PROVIDER_SITE_OTHER): Payer: No Typology Code available for payment source | Admitting: Pharmacist

## 2013-08-10 DIAGNOSIS — Z5181 Encounter for therapeutic drug level monitoring: Secondary | ICD-10-CM

## 2013-08-10 DIAGNOSIS — I4891 Unspecified atrial fibrillation: Secondary | ICD-10-CM

## 2013-08-10 DIAGNOSIS — Z7901 Long term (current) use of anticoagulants: Secondary | ICD-10-CM

## 2013-08-10 LAB — POCT INR: INR: 3.8

## 2013-08-13 ENCOUNTER — Other Ambulatory Visit: Payer: Self-pay

## 2013-08-13 DIAGNOSIS — Z1231 Encounter for screening mammogram for malignant neoplasm of breast: Secondary | ICD-10-CM

## 2013-08-24 ENCOUNTER — Ambulatory Visit (INDEPENDENT_AMBULATORY_CARE_PROVIDER_SITE_OTHER): Payer: No Typology Code available for payment source

## 2013-08-24 DIAGNOSIS — Z5181 Encounter for therapeutic drug level monitoring: Secondary | ICD-10-CM

## 2013-08-24 DIAGNOSIS — I4891 Unspecified atrial fibrillation: Secondary | ICD-10-CM

## 2013-08-24 DIAGNOSIS — Z7901 Long term (current) use of anticoagulants: Secondary | ICD-10-CM

## 2013-08-24 LAB — POCT INR: INR: 2.5

## 2013-09-08 ENCOUNTER — Ambulatory Visit
Admission: RE | Admit: 2013-09-08 | Discharge: 2013-09-08 | Disposition: A | Discharge status: Home / Self Care | Payer: No Typology Code available for payment source | Source: Ambulatory Visit

## 2013-09-08 DIAGNOSIS — Z1231 Encounter for screening mammogram for malignant neoplasm of breast: Secondary | ICD-10-CM

## 2013-09-14 ENCOUNTER — Ambulatory Visit (INDEPENDENT_AMBULATORY_CARE_PROVIDER_SITE_OTHER): Payer: No Typology Code available for payment source | Admitting: *Deleted

## 2013-09-14 DIAGNOSIS — Z7901 Long term (current) use of anticoagulants: Secondary | ICD-10-CM

## 2013-09-14 DIAGNOSIS — I4891 Unspecified atrial fibrillation: Secondary | ICD-10-CM

## 2013-09-14 DIAGNOSIS — Z5181 Encounter for therapeutic drug level monitoring: Secondary | ICD-10-CM

## 2013-09-14 LAB — POCT INR: INR: 3.4

## 2013-09-16 ENCOUNTER — Telehealth: Payer: Self-pay | Admitting: *Deleted

## 2013-09-16 MED ORDER — APIXABAN 5 MG PO TABS: 5.0000 mg | ORAL_TABLET | Freq: Two times a day (BID) | ORAL | Status: DC

## 2013-09-16 NOTE — Telephone Encounter (Signed)
Message copied by Margretta Sidle on Wed Sep 16, 2013  1:42 PM ------      Message from: Darlin Coco      Created: Wed Sep 16, 2013 12:18 PM       I think that Eliquis would be a good choice for her.  Please arrange.      ----- Message -----         From: Margretta Sidle, RN         Sent: 09/16/2013   9:42 AM           To: Darlin Coco, MD            Dr Elio Forget called and states she would like to switch from coumadin to Eliquis. She has checked her insurance and she states the copay for Eliquis is reasonable for her. Please advise      Thank you      Elbert Ewings RN       ------

## 2013-09-16 NOTE — Telephone Encounter (Signed)
Spoke with pt and instructed her to stop coumadin today and begin Eliquis 5mg  tomorrow night (09/17/2013 ) and to take Eliquis 5mg  every 12 hours Also instructed regarding Eliquis to monitor for any signs or symptoms of bleeding or stroke and to take as directed and if missed a dose not to double up on doses. Also instructed not to take any over the counter meds that has Aspirin base and to inform any physician that she is on Eliquis and to call with any problems regarding this medication Appt made for return visit September 17th to have CBC and BMET done and then will discharge her at this time if she is tolerating Eliquis well Pt states understanding of above instructions

## 2013-09-16 NOTE — Telephone Encounter (Signed)
Spoke with pt and she states she has checked her insurance and that would like to switch from coumadin to Eliquis and her insurance co pay is with in reason for her. Note sent to Dr Mare Ferrari regarding above and will call pt back when have any new orders from Dr Mare Ferrari

## 2013-10-08 ENCOUNTER — Encounter: Payer: Self-pay | Admitting: Cardiology

## 2013-10-08 ENCOUNTER — Ambulatory Visit (INDEPENDENT_AMBULATORY_CARE_PROVIDER_SITE_OTHER): Payer: No Typology Code available for payment source | Admitting: Cardiology

## 2013-10-08 ENCOUNTER — Ambulatory Visit (INDEPENDENT_AMBULATORY_CARE_PROVIDER_SITE_OTHER): Payer: No Typology Code available for payment source | Admitting: Pharmacist

## 2013-10-08 VITALS — BP 124/72 | HR 76 | Ht 64.0 in | Wt 194.0 lb

## 2013-10-08 DIAGNOSIS — I4891 Unspecified atrial fibrillation: Secondary | ICD-10-CM

## 2013-10-08 DIAGNOSIS — I119 Hypertensive heart disease without heart failure: Secondary | ICD-10-CM

## 2013-10-08 DIAGNOSIS — Z7901 Long term (current) use of anticoagulants: Secondary | ICD-10-CM

## 2013-10-08 DIAGNOSIS — Z79899 Other long term (current) drug therapy: Secondary | ICD-10-CM

## 2013-10-08 DIAGNOSIS — I480 Paroxysmal atrial fibrillation: Secondary | ICD-10-CM

## 2013-10-08 NOTE — Patient Instructions (Signed)

## 2013-10-08 NOTE — Progress Notes (Signed)
Elaine [name] Date of Birth:  25-Feb-1965 [address] [place], [place]  [address] [phone]  Fax   [phone]  HPI:  This pleasant 48 year old woman returns for a scheduled followup office visit. She had been seen in [facility] emergency room in November 2014 with paroxysmal atrial fibrillation. She converted on IV diltiazem. She gave a past history of tachycardia palpitations. She had an evaluation at [facility] in 2001 for intermittent palpitations. Her echocardiogram at that time was normal.  The patient was seen in the emergency room at [facility] on 12/10/12. She was noted to be mildly hypertensive and her Chadsvasc score was 2 for hypertension and female sex. She was started on warfarin. She preferred warfarin to one of the NOAC drugs because of its reversibility.  She had an echocardiogram done prior to leaving the emergency room on 12/10/12 and the echocardiogram was normal with left ventricular ejection fraction of 60-65% and no valvular lesions. She was discharged from the emergency room on diltiazem CD 120 once a day and on Coumadin.  Since discharge she has decided that she would prefer to go on a NOAC.  She switched to Apixaban 5 mg twice a day in August.  She is tolerating it well. She has had rare palpitations which have subsided since we increased her diltiazem to 240 mg daily  Current Outpatient Prescriptions  Medication Sig Dispense Refill  . apixaban (ELIQUIS) 5 MG TABS tablet Take 1 tablet (5 mg total) by mouth 2 (two) times daily.  60 tablet  11  . carboxymethylcellulose (REFRESH PLUS) 0.5 % SOLN Place 2 drops into both eyes 3 (three) times daily as needed.      . cholecalciferol (VITAMIN D) 1000 UNITS tablet Take 1,000 Units by mouth daily.      Marland Kitchen diltiazem (CARDIZEM CD) 240 MG 24 hr capsule Take 1 capsule (240 mg total) by mouth daily.  30 capsule  5  . Flaxseed, Linseed, (FLAX SEEDS) POWD Take by mouth as directed.      .  Multiple Vitamins-Minerals (CENTRUM ULTRA WOMENS) TABS Take 1 tablet by mouth daily.       Marland Kitchen OVER THE COUNTER MEDICATION Take by mouth. Macafem      . SUMAtriptan (IMITREX) 25 MG tablet Take 25 mg by mouth once. May repeat in 2 hours if headache persists or recurs.       No current facility-administered medications for this visit.    Allergies  Allergen Reactions  . Other Other (See Comments)    Raw Almonds, itching   Peaches, itching  . Ampicillin Rash    Has taken amoxicillin without any reaction    Patient Active Problem List   Diagnosis Date Noted  . Encounter for therapeutic drug monitoring 02/25/2013  . Benign hypertensive heart disease without heart failure 12/19/2012  . Atrial fibrillation 12/12/2012  . Long term (current) use of anticoagulants 12/12/2012    History  Smoking status  . Never Smoker   Smokeless tobacco  . Not on file    Comment: Secondhand smoke up to age 56    History  Alcohol Use  . Yes    Comment: Occasional glass of wine on weekend or with dinner    Family History  Problem Relation Age of Onset  . Atrial fibrillation Father   . Atrial fibrillation      Aunt  . Stroke Mother   . CAD Paternal Uncle     CABG age  33  . Migraines Sister   . Aneurysm Mother     Also had blood clot after sister was born  . Peripheral vascular disease Mother     In her 70's    Review of Systems: The patient denies any heat or cold intolerance.  No weight gain or weight loss.  The patient denies headaches or blurry vision.  There is no cough or sputum production.  The patient denies dizziness.  There is no hematuria or hematochezia.  The patient denies any muscle aches or arthritis.  The patient denies any rash.  The patient denies frequent falling or instability.  There is no history of depression or anxiety.  All other systems were reviewed and are negative.   Physical Exam: Filed Vitals:   10/08/13 1540  BP: 124/72  Pulse: 76  The patient appears to be  in no distress.  Head and neck exam reveals that the pupils are equal and reactive.  The extraocular movements are full.  There is no scleral icterus.  Mouth and pharynx are benign.  No lymphadenopathy.  No carotid bruits.  The jugular venous pressure is normal.  Thyroid is not enlarged or tender.  Chest is clear to percussion and auscultation.  No rales or rhonchi.  Expansion of the chest is symmetrical.  Heart reveals no abnormal lift or heave.  First and second heart sounds are normal.  There is no murmur gallop rub or click.  The abdomen is soft and nontender.  Bowel sounds are normoactive.  There is no hepatosplenomegaly or mass.  There are no abdominal bruits.  Extremities reveal no phlebitis or edema.  Pedal pulses are good.  There is no cyanosis or clubbing.  Neurologic exam is normal strength and no lateralizing weakness.  No sensory deficits.  Integument reveals no rash  EKG shows normal sinus rhythm with vertical axis and no ischemic changes.   Assessment / Plan: 1. paroxysmal atrial fibrillation she has chadssvasc score of 2 2. labile hypertension 3. perimenopausal state  Plan: Continue Apixaban 5 mg twice a day.  We are checking a CBC and a basal metabolic panel today. Recheck in 6 months for followup office visit

## 2013-10-08 NOTE — Patient Instructions (Signed)
Will obtain labs today and call you with the results (cbc/bmet)  Your physician recommends that you continue on your current medications as directed. Please refer to the Current Medication list given to you today.  Your physician wants you to follow-up in: 6 month ov You will receive a reminder letter in the mail two months in advance. If you don't receive a letter, please call our office to schedule the follow-up appointment.

## 2013-10-08 NOTE — Progress Notes (Signed)
Pt was started on Eliquis 5 mg bid for Afib on 09/17/13   Reviewed patients medication list.  Pt is not currently on any combined P-gp and strong CYP3A4 inhibitors/inducers (ketoconazole, traconazole, ritonavir, carbamazepine, phenytoin, rifampin, St. John's wort).  Reviewed labs.  SCr 1.0, Weight 88 kg, CrCl- 95 ml/min.  Dose appropriate based on renal function, age, and weight.   Hgb and HCT normal.  Patient is tolerating Eliquis 5 mg bid well.  She has been compliant with medication, and denies any problems with bleeding.  Cost has been $10 per month so is not a problem.  Will check a BMET and CBC today after she sees Dr. Mare Ferrari for office visit.  A full discussion of the nature of anticoagulants has been carried out.  A benefit/risk analysis has been presented to the patient, so that they understand the justification for choosing anticoagulation with Eliquis at this time.  The need for compliance is stressed.  Pt is aware to take the medication twice daily.  Side effects of potential bleeding are discussed, including unusual colored urine or stools, coughing up blood or coffee ground emesis, nose bleeds or serious fall or head trauma.  Discussed signs and symptoms of stroke. The patient should avoid any OTC items containing aspirin or ibuprofen.  Avoid alcohol consumption.   Call if any signs of abnormal bleeding.  Discussed financial obligations and resolved any difficulty in obtaining medication.  Next lab test test in 6 months.

## 2013-10-09 LAB — CBC WITH DIFFERENTIAL/PLATELET
BASOS ABS: 0 10*3/uL (ref 0.0–0.1)
Basophils Relative: 0.3 % (ref 0.0–3.0)
EOS ABS: 0.1 10*3/uL (ref 0.0–0.7)
Eosinophils Relative: 0.9 % (ref 0.0–5.0)
HCT: 39.9 % (ref 36.0–46.0)
HEMOGLOBIN: 13.3 g/dL (ref 12.0–15.0)
LYMPHS PCT: 35.3 % (ref 12.0–46.0)
Lymphs Abs: 3.2 10*3/uL (ref 0.7–4.0)
MCHC: 33.3 g/dL (ref 30.0–36.0)
MCV: 92.6 fl (ref 78.0–100.0)
Monocytes Absolute: 0.7 10*3/uL (ref 0.1–1.0)
Monocytes Relative: 7.6 % (ref 3.0–12.0)
NEUTROS ABS: 5.1 10*3/uL (ref 1.4–7.7)
Neutrophils Relative %: 55.9 % (ref 43.0–77.0)
PLATELETS: 379 10*3/uL (ref 150.0–400.0)
RBC: 4.31 Mil/uL (ref 3.87–5.11)
RDW: 13.1 % (ref 11.5–15.5)
WBC: 9.1 10*3/uL (ref 4.0–10.5)

## 2013-10-09 LAB — BASIC METABOLIC PANEL
BUN: 15 mg/dL (ref 6–23)
CHLORIDE: 102 meq/L (ref 96–112)
CO2: 30 mEq/L (ref 19–32)
CREATININE: 1 mg/dL (ref 0.4–1.2)
Calcium: 9.3 mg/dL (ref 8.4–10.5)
GFR: 62 mL/min (ref >60.00)
Glucose, Bld: 89 mg/dL (ref 70–99)
Potassium: 4.2 mEq/L (ref 3.5–5.1)
SODIUM: 139 meq/L (ref 135–145)

## 2013-10-09 NOTE — Progress Notes (Signed)
Quick Note:  Please report to patient. The recent labs are stable. Continue same medication and careful diet. ______ 

## 2014-01-19 ENCOUNTER — Other Ambulatory Visit: Payer: Self-pay | Admitting: Cardiology

## 2014-03-30 ENCOUNTER — Ambulatory Visit: Payer: Self-pay | Admitting: *Deleted

## 2014-03-30 DIAGNOSIS — I4891 Unspecified atrial fibrillation: Secondary | ICD-10-CM

## 2014-03-30 DIAGNOSIS — Z5181 Encounter for therapeutic drug level monitoring: Secondary | ICD-10-CM

## 2014-04-06 ENCOUNTER — Ambulatory Visit (INDEPENDENT_AMBULATORY_CARE_PROVIDER_SITE_OTHER): Payer: No Typology Code available for payment source | Admitting: Cardiology

## 2014-04-06 ENCOUNTER — Encounter: Payer: Self-pay | Admitting: Cardiology

## 2014-04-06 VITALS — BP 128/80 | HR 65 | Ht 64.0 in | Wt 200.4 lb

## 2014-04-06 DIAGNOSIS — I480 Paroxysmal atrial fibrillation: Secondary | ICD-10-CM

## 2014-04-06 DIAGNOSIS — I119 Hypertensive heart disease without heart failure: Secondary | ICD-10-CM

## 2014-04-06 NOTE — Patient Instructions (Signed)
Your physician recommends that you continue on your current medications as directed. Please refer to the Current Medication list given to you today.  Your physician wants you to follow-up in: 6 MONTH OV /EKG You will receive a reminder letter in the mail two months in advance. If you don't receive a letter, please call our office to schedule the follow-up appointment.  

## 2014-04-06 NOTE — Progress Notes (Signed)
Cardiology Office Note   Date:  04/06/2014   ID:  Elaine [name], DOB 05/10/1965, MRN [id number]  PCP:  Cari Caraway, MD  Cardiologist:   Darlin Coco, MD   No chief complaint on file.     History of Present Illness: Elaine [name] is a 49 y.o. female who presents for a month follow-up office visit   This pleasant 49 year old woman returns for a scheduled followup office visit. She had been seen in [facility] emergency room in November 2014 with paroxysmal atrial fibrillation. She converted on IV diltiazem. She gave a past history of tachycardia palpitations. She had an evaluation at [facility] in 2001 for intermittent palpitations. Her echocardiogram at that time was normal.  The patient was seen in the emergency room at [facility] on 12/10/12. She was noted to be mildly hypertensive and her Chadsvasc score was 2 for hypertension and female sex. She was started on warfarin. She preferred warfarin to one of the NOAC drugs because of its reversibility.  She had an echocardiogram done prior to leaving the emergency room on 12/10/12 and the echocardiogram was normal with left ventricular ejection fraction of 60-65% and no valvular lesions. She was discharged from the emergency room on diltiazem CD 120 once a day and on Coumadin.  Since discharge she has decided that she would prefer to go on a NOAC. She switched to Apixaban 5 mg twice a day in August 2015. She is tolerating it well. She has had rare palpitations which have subsided since we increased her diltiazem to 240 mg daily. The patient has had a difficult time with weight gain.  She is perimenopausal.  Her menses are about 3 months apart mouth.  For a while she was on the Weight Wat[employer] program.  Since last visit she has gained 6 pounds.  He does try to walk every day, often at noon He has had no recurrent atrial fibrillation episodes.  Past Medical History  Diagnosis Date  . Migraines   . Hypercholesterolemia    Borderline - not requiring medicines  . Seasonal allergies   . Anemia     In past - borderline anemia requiring iron  . Atrial fibrillation     a. Formal dx 12/10/12 - suspected paroxysmal.  . HTN (hypertension)     a. Formally dx 11/2012 - mild.    Past Surgical History  Procedure Laterality Date  . Breast surgery    . Egg donation      In her 20's.     Current Outpatient Prescriptions  Medication Sig Dispense Refill  . apixaban (ELIQUIS) 5 MG TABS tablet Take 1 tablet (5 mg total) by mouth 2 (two) times daily. 60 tablet 11  . carboxymethylcellulose (REFRESH PLUS) 0.5 % SOLN Place 2 drops into both eyes 3 (three) times daily as needed.    . cholecalciferol (VITAMIN D) 1000 UNITS tablet Take 1,000 Units by mouth daily.    Marland Kitchen diltiazem (CARDIZEM CD) 240 MG 24 hr capsule TAKE ONE CAPSULE BY MOUTH ONCE DAILY 30 capsule 2  . Flaxseed, Linseed, (FLAX SEEDS) POWD Take by mouth as directed.    . Multiple Vitamins-Minerals (CENTRUM ULTRA WOMENS) TABS Take 1 tablet by mouth daily.     Marland Kitchen OVER THE COUNTER MEDICATION Take by mouth. Macafem    . SUMAtriptan (IMITREX) 25 MG tablet Take 25 mg by mouth once. May repeat in 2 hours if headache persists or recurs.     No current facility-administered medications for this  visit.    Allergies:   Other and Ampicillin    Social History:  The patient  reports that she has never smoked. She does not have any smokeless tobacco history on file. She reports that she drinks alcohol. She reports that she does not use illicit drugs.   Family History:  The patient's family history includes Aneurysm in her mother; Atrial fibrillation in her father and another family member; CAD in her paternal uncle; Migraines in her sister; Peripheral vascular disease in her mother; Stroke in her mother.    ROS:  Please see the history of present illness.   Otherwise, review of systems are positive for left hip pain intermittently and she has a past history of a childhood  left hip infection.  He has been using Tylenol which has helped.  She has also had some problems with worsening constipation.  It appears to be responding to over-the-counter stool softeners and she is trying to drink more water..   All other systems are reviewed and negative.    PHYSICAL EXAM: VS:  BP 128/80 mmHg  Pulse 65  Ht 5\' 4"  (1.626 m)  Wt 200 lb 6.4 oz (90.901 kg)  BMI 34.38 kg/m2  SpO2 99% , BMI Body mass index is 34.38 kg/(m^2). GEN: Well nourished, well developed, in no acute distress HEENT: normal Neck: no JVD, carotid bruits, or masses Cardiac: RRR; no murmurs, rubs, or gallops,no edema  Respiratory:  clear to auscultation bilaterally, normal work of breathing GI: soft, nontender, nondistended, + BS MS: no deformity or atrophy Skin: warm and dry, no rash Neuro:  Strength and sensation are intact Psych: euthymic mood, full affect   EKG:  EKG is not ordered today.    Recent Labs: 10/08/2013: BUN 15; Creatinine 1.0; Hemoglobin 13.3; Platelets 379.0; Potassium 4.2; Sodium 139    Lipid Panel No results found for: CHOL, TRIG, HDL, CHOLHDL, VLDL, LDLCALC, LDLDIRECT    Wt Readings from Last 3 Encounters:  04/06/14 200 lb 6.4 oz (90.901 kg)  10/08/13 194 lb (87.998 kg)  05/08/13 188 lb (85.276 kg)       ASSESSMENT AND PLAN:  1.paroxysmal atrial fibrillation she has chadssvasc score of 2 2. labile hypertension 3. perimenopausal state  Plan: Continue Apixaban 5 mg twice a day.     Current medicines are reviewed at length with the patient today.  The patient does not have concerns regarding medicines.  The following changes have been made:  no change  Labs/ tests ordered today include:  No orders of the defined types were placed in this encounter.     Recheck in 6 months for follow-up office visit and EKG.  Continue to get regular exercise and to work harder on weight loss.  Signed, Darlin Coco, MD  04/06/2014 6:32 PM    [facility] [address], [place], [place]  [address] Phone: [phone]; Fax: [phone]

## 2014-04-20 ENCOUNTER — Other Ambulatory Visit: Payer: Self-pay | Admitting: Cardiology

## 2014-09-04 ENCOUNTER — Other Ambulatory Visit: Payer: Self-pay | Admitting: Cardiology

## 2014-10-04 ENCOUNTER — Other Ambulatory Visit: Payer: Self-pay | Admitting: Cardiology

## 2014-10-05 ENCOUNTER — Ambulatory Visit (INDEPENDENT_AMBULATORY_CARE_PROVIDER_SITE_OTHER): Payer: BLUE CROSS/BLUE SHIELD | Admitting: Nurse Practitioner

## 2014-10-05 ENCOUNTER — Encounter: Payer: Self-pay | Admitting: Nurse Practitioner

## 2014-10-05 VITALS — BP 130/90 | HR 74 | Ht 65.0 in | Wt 194.8 lb

## 2014-10-05 DIAGNOSIS — I480 Paroxysmal atrial fibrillation: Secondary | ICD-10-CM

## 2014-10-05 DIAGNOSIS — Z7901 Long term (current) use of anticoagulants: Secondary | ICD-10-CM | POA: Diagnosis not present

## 2014-10-05 DIAGNOSIS — I4891 Unspecified atrial fibrillation: Secondary | ICD-10-CM

## 2014-10-05 LAB — CBC
HCT: 40.9 % (ref 36.0–46.0)
Hemoglobin: 13.6 g/dL (ref 12.0–15.0)
MCHC: 33.3 g/dL (ref 30.0–36.0)
MCV: 91.5 fl (ref 78.0–100.0)
Platelets: 344 10*3/uL (ref 150.0–400.0)
RBC: 4.47 Mil/uL (ref 3.87–5.11)
RDW: 13.1 % (ref 11.5–15.5)
WBC: 7.1 10*3/uL (ref 4.0–10.5)

## 2014-10-05 LAB — BASIC METABOLIC PANEL
BUN: 18 mg/dL (ref 6–23)
CO2: 30 mEq/L (ref 19–32)
Calcium: 9.7 mg/dL (ref 8.4–10.5)
Chloride: 103 mEq/L (ref 96–112)
Creatinine, Ser: 0.99 mg/dL (ref 0.40–1.20)
GFR: 63.19 mL/min (ref >60.00)
Glucose, Bld: 79 mg/dL (ref 70–99)
Potassium: 4.2 mEq/L (ref 3.5–5.1)
Sodium: 140 mEq/L (ref 135–145)

## 2014-10-05 NOTE — Patient Instructions (Addendum)
We will be checking the following labs today - BMET and CBC   Medication Instructions:    Continue with your current medicines.     Testing/Procedures To Be Arranged:  N/A  Follow-Up:   See me in 6 months  We will have our sleep coordinator arrange a sleep study for you at your convenience    Other Special Instructions:   Keep working on your weight  Keep a check on your BP - goal is < 135/85 most of the time.  Call the [place] office at [phone] if you have any questions, problems or concerns.

## 2014-10-05 NOTE — Progress Notes (Signed)
CARDIOLOGY OFFICE NOTE  Date:  10/05/2014    [name] Date of Birth: 1965/10/29 Medical Record #[id number]  PCP:  Cari Caraway, MD  Cardiologist:  Mare Ferrari    Chief Complaint  Patient presents with  . Atrial Fibrillation    6 month check - seen for Dr. Mare Ferrari    History of Present Illness: Elaine [name] is a 49 y.o. female who presents today for a 6 month check. Seen for Dr. Mare Ferrari. She has a history of PAF -  seen in [facility] in November 2014 with paroxysmal atrial fibrillation. She converted on IV diltiazem.  Her echocardiogram at that time was normal. Chadsvasc score was 2 for hypertension and female sex. She was started on warfarin and then switched to NOAC.   Last seen back in March and she was doing well.  Comes in today. Here alone. Doing well. Rhythm is good. No bleeding issues. Tolerating medicines well. Has some other issues - her feet/toes are tingling/buring - wakes her up at night - she is not diabetic - does have some back issues. Some occasional swelling - may be related to extra salt but typically restricts her salt. No chest pain. She is walking more. BP is at goal at home. Wonders if she might have sleep apnea - understands it is a risk factor for AF.   Past Medical History  Diagnosis Date  . Migraines   . Hypercholesterolemia     Borderline - not requiring medicines  . Seasonal allergies   . Anemia     In past - borderline anemia requiring iron  . Atrial fibrillation     a. Formal dx 12/10/12 - suspected paroxysmal.  . HTN (hypertension)     a. Formally dx 11/2012 - mild.    Past Surgical History  Procedure Laterality Date  . Breast surgery    . Egg donation      In her 20's.     Medications: Current Outpatient Prescriptions  Medication Sig Dispense Refill  . carboxymethylcellulose (REFRESH PLUS) 0.5 % SOLN Place 2 drops into both eyes 3 (three) times daily as needed.    . cholecalciferol (VITAMIN D) 1000 UNITS tablet Take  1,000 Units by mouth daily.    Marland Kitchen diltiazem (CARDIZEM CD) 240 MG 24 hr capsule TAKE ONE CAPSULE BY MOUTH ONCE DAILY 30 capsule 6  . ELIQUIS 5 MG TABS tablet TAKE 1 TABLET (5 MG TOTAL) BY MOUTH 2 (TWO) TIMES DAILY. 60 tablet 1  . Flaxseed, Linseed, (FLAX SEEDS) POWD Take by mouth as directed.    . Multiple Vitamins-Minerals (CENTRUM ULTRA WOMENS) TABS Take 1 tablet by mouth daily.     Marland Kitchen OVER THE COUNTER MEDICATION Take by mouth. Macafem     No current facility-administered medications for this visit.    Allergies: Allergies  Allergen Reactions  . Other Other (See Comments)    Raw Almonds, itching   Peaches, itching  . Ampicillin Rash    Has taken amoxicillin without any reaction    Social History: The patient  reports that she has never smoked. She does not have any smokeless tobacco history on file. She reports that she drinks alcohol. She reports that she does not use illicit drugs.   Family History: The patient's family history includes Aneurysm in her mother; Atrial fibrillation in her father and another family member; CAD in her paternal uncle; Migraines in her sister; Peripheral vascular disease in her mother; Stroke in her mother.   Review  of Systems: Please see the history of present illness.   Otherwise, the review of systems is positive for leg swelling, mild DOE, facial swelling, constipation and burning/twinges of pain in feet more at night.   All other systems are reviewed and negative.   Physical Exam: VS:  BP 130/90 mmHg  Pulse 74  Ht 5\' 5"  (1.651 m)  Wt 194 lb 12.8 oz (88.361 kg)  BMI 32.42 kg/m2  SpO2 97% .  BMI Body mass index is 32.42 kg/(m^2).  Wt Readings from Last 3 Encounters:  10/05/14 194 lb 12.8 oz (88.361 kg)  04/06/14 200 lb 6.4 oz (90.901 kg)  10/08/13 194 lb (87.998 kg)    General: Pleasant. Well developed, well nourished and in no acute distress. She is obese.  HEENT: Normal. Neck: Supple, no JVD, carotid bruits, or masses noted.  Cardiac:  Regular rate and rhythm. No murmurs, rubs, or gallops. No edema.  Respiratory:  Lungs are clear to auscultation bilaterally with normal work of breathing.  GI: Soft and nontender.  MS: No deformity or atrophy. Gait and ROM intact. Skin: Warm and dry. Color is normal.  Neuro:  Strength and sensation are intact and no gross focal deficits noted.  Psych: Alert, appropriate and with normal affect.   LABORATORY DATA:  EKG:  EKG is ordered today. This demonstrates NSR.  Lab Results  Component Value Date   WBC 9.1 10/08/2013   HGB 13.3 10/08/2013   HCT 39.9 10/08/2013   PLT 379.0 10/08/2013   GLUCOSE 89 10/08/2013   NA 139 10/08/2013   K 4.2 10/08/2013   CL 102 10/08/2013   CREATININE 1.0 10/08/2013   BUN 15 10/08/2013   CO2 30 10/08/2013   TSH 1.224 12/10/2012   INR 3.4 09/14/2013    BNP (last 3 results) No results for input(s): BNP in the last 8760 hours.  ProBNP (last 3 results) No results for input(s): PROBNP in the last 8760 hours.   Other Studies Reviewed Today:  Echo Study Conclusions from 2014  Left ventricle: The cavity size was normal. Wall thickness was normal. Systolic function was normal. The estimated ejection fraction was in the range of 60% to 65%. Wall motion was normal; there were no regional wall motion abnormalities.     Assessment/Plan: 1. PAF - with CHADSVASC of 2. No recurrence. She has not been tested for sleep apnea - her partner does wear CPAP - she is interested in proceeding.   2. HTN - she has better rate control at home. She will continue to monitor.   3. Obesity - needs to focus on weight reduction - her obesity is an added risk factor for having recurrent AF.   4. Chronic anticoagulation - needs follow up labs today. No problems noted with Eliquis.   Current medicines are reviewed with the patient today.  The patient does not have concerns regarding medicines other than what has been noted above.  The following changes have been  made:  See above.  Labs/ tests ordered today include:    Orders Placed This Encounter  Procedures  . Basic metabolic panel  . CBC  . EKG 12-Lead     Disposition:   FU with me in 6 months - she has been informed of Dr. Sherryl Barters retirement.   Patient is agreeable to this plan and will call if any problems develop in the interim.   Signed: Burtis Junes, RN, ANP-C 10/05/2014 8:55 AM  [facility] [address]  [place], [place]  [address] Phone: [phone] Fax: [phone]

## 2014-10-06 ENCOUNTER — Telehealth: Payer: Self-pay | Admitting: Nurse Practitioner

## 2014-10-06 NOTE — Telephone Encounter (Signed)
New Messages   Pt is calling back, returning call about lab results please call pt

## 2014-10-07 NOTE — Telephone Encounter (Signed)
Pt notified of lab results by phone with verbal understanding; results sent to [facility] as well for pt's review.

## 2014-10-08 ENCOUNTER — Other Ambulatory Visit: Payer: Self-pay | Admitting: *Deleted

## 2014-10-08 DIAGNOSIS — G4733 Obstructive sleep apnea (adult) (pediatric): Secondary | ICD-10-CM

## 2014-10-19 ENCOUNTER — Other Ambulatory Visit: Payer: Self-pay

## 2014-10-19 DIAGNOSIS — Z1231 Encounter for screening mammogram for malignant neoplasm of breast: Secondary | ICD-10-CM

## 2014-11-01 ENCOUNTER — Ambulatory Visit: Payer: BLUE CROSS/BLUE SHIELD

## 2014-11-08 ENCOUNTER — Other Ambulatory Visit: Payer: Self-pay | Admitting: Cardiology

## 2014-11-10 ENCOUNTER — Ambulatory Visit
Admission: RE | Admit: 2014-11-10 | Discharge: 2014-11-10 | Disposition: A | Discharge status: Home / Self Care | Payer: BLUE CROSS/BLUE SHIELD | Source: Ambulatory Visit

## 2014-11-10 DIAGNOSIS — Z1231 Encounter for screening mammogram for malignant neoplasm of breast: Secondary | ICD-10-CM

## 2014-12-02 ENCOUNTER — Ambulatory Visit (HOSPITAL_BASED_OUTPATIENT_CLINIC_OR_DEPARTMENT_OTHER): Payer: BLUE CROSS/BLUE SHIELD | Attending: Nurse Practitioner | Admitting: *Deleted

## 2014-12-02 VITALS — Ht 64.0 in | Wt 194.0 lb

## 2014-12-02 DIAGNOSIS — I4891 Unspecified atrial fibrillation: Secondary | ICD-10-CM

## 2014-12-02 DIAGNOSIS — I480 Paroxysmal atrial fibrillation: Secondary | ICD-10-CM

## 2014-12-02 DIAGNOSIS — R0683 Snoring: Secondary | ICD-10-CM | POA: Diagnosis not present

## 2014-12-02 DIAGNOSIS — G4733 Obstructive sleep apnea (adult) (pediatric): Secondary | ICD-10-CM | POA: Diagnosis present

## 2014-12-03 ENCOUNTER — Encounter (HOSPITAL_BASED_OUTPATIENT_CLINIC_OR_DEPARTMENT_OTHER): Payer: Self-pay | Admitting: *Deleted

## 2014-12-03 ENCOUNTER — Telehealth: Payer: Self-pay | Admitting: Cardiology

## 2014-12-03 DIAGNOSIS — R0683 Snoring: Secondary | ICD-10-CM

## 2014-12-03 HISTORY — DX: Snoring: R06.83

## 2014-12-03 NOTE — Telephone Encounter (Signed)
Please let patient know that sleep study showed no significant sleep apnea.    

## 2014-12-03 NOTE — Telephone Encounter (Signed)
Left message for patient to call for results. 

## 2014-12-03 NOTE — Sleep Study (Signed)
   Patient Name: Elaine [name], Elaine [name] MRN: [id number] Study Date: 12/02/2014 Gender: Female D.O.B: 23-Mar-1965 Age (years): 25 Referring Provider: Truitt Merle Interpreting Physician: Fransico Him MD, ABSM RPSGT: [place], Holly Weight (lbs): 193  Height (inches): 64 BMI: 33 Neck Size: 13.50  CLINICAL INFORMATION Sleep Study Type: NPSG Indication for sleep study: OSA Epworth Sleepiness Score: 5  SLEEP STUDY TECHNIQUE As per the AASM Manual for the Scoring of Sleep and Associated Events v2.3 (April 2016) with a hypopnea requiring 4% desaturations. The channels recorded and monitored were frontal, central and occipital EEG, electrooculogram (EOG), submentalis EMG (chin), nasal and oral airflow, thoracic and abdominal wall motion, anterior tibialis EMG, snore microphone, electrocardiogram, and pulse oximetry.  MEDICATIONS Patient's medications include: Vit D, Cardizem, Eliquis. Medications self-administered by patient during sleep study : No sleep medicine administered.  SLEEP ARCHITECTURE The study was initiated at 10:04:25 PM and ended at 5:18:32 AM. Sleep onset time was 8.9 minutes and the sleep efficiency was 88.9%. The total sleep time was 385.7 minutes. Stage REM latency was 105.0 minutes. The patient spent 4.54% of the night in stage N1 sleep, 61.57% in stage N2 sleep, 7.91% in stage N3 and 25.99% in REM. Alpha intrusion was absent. Supine sleep was 37.19%.  RESPIRATORY PARAMETERS The overall apnea/hypopnea index (AHI) was 2.3 per hour. There were 0 total apneas, including 0 obstructive, 0 central and 0 mixed apneas. There were 15 hypopneas and 2 RERAs. The AHI during Stage REM sleep was 6.0 per hour. AHI while supine was 6.3 per hour. The mean oxygen saturation was 93.10%. The minimum SpO2 during sleep was 86.00%. Soft snoring was noted during this study.  CARDIAC DATA The 2 lead EKG demonstrated sinus rhythm. The mean heart rate was 65.41 beats per minute.   LEG  MOVEMENT DATA The total PLMS were 0 with a resulting PLMS index of 0.00. Associated arousal with leg movement index was 0.0 .  IMPRESSIONS - No significant obstructive sleep apnea occurred during this study (AHI = 2.3/h). - No significant central sleep apnea occurred during this study (CAI = 0.0/h). - Mild oxygen desaturation was noted during this study (Min O2 = 86.00%).  Time spent with Oxygen saturations < 88% was 1.2 minutes. - The patient snored with Soft snoring volume. - Clinically significant periodic limb movements did not occur during sleep. No significant associated arousals.  DIAGNOSIS - Snoring  RECOMMENDATIONS - Avoid alcohol, sedatives and other CNS depressants that may result in sleep apnea and disrupt normal sleep architecture. - Sleep hygiene should be reviewed to assess factors that may improve sleep quality. - Weight management and regular exercise should be initiated or continued if appropriate.   [place], American Board of Sleep Medicine  ELECTRONICALLY SIGNED ON:  12/03/2014, 10:31 AM [place] PH: [phone]   FX: [phone] [place]

## 2014-12-03 NOTE — Telephone Encounter (Signed)
Patient informed of results.  Stated verbal understanding 

## 2014-12-26 ENCOUNTER — Ambulatory Visit (HOSPITAL_BASED_OUTPATIENT_CLINIC_OR_DEPARTMENT_OTHER): Payer: BLUE CROSS/BLUE SHIELD

## 2015-02-18 ENCOUNTER — Telehealth: Payer: Self-pay

## 2015-02-18 NOTE — Telephone Encounter (Signed)
Pt clearance faxed to [facility] Attn: Dr. Michail Sermon [phone] fax

## 2015-03-09 ENCOUNTER — Other Ambulatory Visit: Payer: Self-pay | Admitting: Cardiology

## 2015-03-29 ENCOUNTER — Encounter: Payer: Self-pay | Admitting: Nurse Practitioner

## 2015-03-29 ENCOUNTER — Ambulatory Visit (INDEPENDENT_AMBULATORY_CARE_PROVIDER_SITE_OTHER): Payer: BLUE CROSS/BLUE SHIELD | Admitting: Nurse Practitioner

## 2015-03-29 VITALS — BP 130/80 | HR 68 | Ht 64.0 in | Wt 194.0 lb

## 2015-03-29 DIAGNOSIS — E785 Hyperlipidemia, unspecified: Secondary | ICD-10-CM

## 2015-03-29 DIAGNOSIS — I119 Hypertensive heart disease without heart failure: Secondary | ICD-10-CM

## 2015-03-29 DIAGNOSIS — I480 Paroxysmal atrial fibrillation: Secondary | ICD-10-CM | POA: Diagnosis not present

## 2015-03-29 DIAGNOSIS — Z7901 Long term (current) use of anticoagulants: Secondary | ICD-10-CM

## 2015-03-29 LAB — CBC
HCT: 40.6 % (ref 36.0–46.0)
Hemoglobin: 13.6 g/dL (ref 12.0–15.0)
MCH: 30 pg (ref 26.0–34.0)
MCHC: 33.5 g/dL (ref 30.0–36.0)
MCV: 89.4 fL (ref 78.0–100.0)
MPV: 9.1 fL (ref 8.6–12.4)
Platelets: 390 10*3/uL (ref 150–400)
RBC: 4.54 MIL/uL (ref 3.87–5.11)
RDW: 13.3 % (ref 11.5–15.5)
WBC: 7.9 10*3/uL (ref 4.0–10.5)

## 2015-03-29 LAB — BASIC METABOLIC PANEL
BUN: 20 mg/dL (ref 7–25)
CO2: 25 mmol/L (ref 20–31)
Calcium: 9.4 mg/dL (ref 8.6–10.4)
Chloride: 101 mmol/L (ref 98–110)
Creat: 1.07 mg/dL — ABNORMAL HIGH (ref 0.50–1.05)
Glucose, Bld: 94 mg/dL (ref 65–99)
Potassium: 4.6 mmol/L (ref 3.5–5.3)
Sodium: 136 mmol/L (ref 135–146)

## 2015-03-29 LAB — LIPID PANEL
Cholesterol: 215 mg/dL — ABNORMAL HIGH (ref 125–200)
HDL: 67 mg/dL (ref >=46)
LDL Cholesterol: 127 mg/dL (ref <130)
Total CHOL/HDL Ratio: 3.2 Ratio (ref <=5.0)
Triglycerides: 106 mg/dL (ref <150)
VLDL: 21 mg/dL (ref <30)

## 2015-03-29 LAB — HEPATIC FUNCTION PANEL
ALT: 14 U/L (ref 6–29)
AST: 16 U/L (ref 10–35)
Albumin: 4.3 g/dL (ref 3.6–5.1)
Alkaline Phosphatase: 65 U/L (ref 33–130)
Bilirubin, Direct: 0.1 mg/dL (ref <=0.2)
Indirect Bilirubin: 0.5 mg/dL (ref 0.2–1.2)
Total Bilirubin: 0.6 mg/dL (ref 0.2–1.2)
Total Protein: 7.1 g/dL (ref 6.1–8.1)

## 2015-03-29 NOTE — Progress Notes (Addendum)
CARDIOLOGY OFFICE NOTE  Date:  03/29/2015    Elaine [name] Date of Birth: 1965/12/16 Medical Record [id number]  PCP:  Cari Caraway, MD  Cardiologist:  Former Environmental consultant Complaint  Patient presents with  . Atrial Fibrillation    6 month check - former patient of Dr. Sherryl Barters    History of Present Illness: Elaine [name] is a 50 y.o. female who presents today for a 6 month check. Former patient of Dr. Sherryl Barters.  She has a history of PAF - seen in [facility] back in November 2014 with paroxysmal atrial fibrillation. She converted on IV diltiazem. Her echocardiogram at that time was normal. Chadsvasc score was 2 for hypertension and female sex. She was started on warfarin and then switched to NOAC. Other issues include past anemia, HLD and HTN.   Last seen back in September and she was doing ok. Referred on for sleep study.   Comes in today. Here alone. Doing well. Had her screening colonoscopy last month. Back on her anticoagulation. Rhythm has been fine. Started walking about a month ago - walking about 2 to 3 miles 4 x a week. Has joined a weight loss program and is very motivated to work on her weight and try to get off of her BP medicine. She did have a sleep study and was negative for OSA.   Past Medical History  Diagnosis Date  . Migraines   . Hypercholesterolemia     Borderline - not requiring medicines  . Seasonal allergies   . Anemia     In past - borderline anemia requiring iron  . Atrial fibrillation ([place])     a. Formal dx 12/10/12 - suspected paroxysmal.  . HTN (hypertension)     a. Formally dx 11/2012 - mild.  . Snoring 12/03/2014    Past Surgical History  Procedure Laterality Date  . Breast surgery    . Egg donation      In her 20's.     Medications: Current Outpatient Prescriptions  Medication Sig Dispense Refill  . carboxymethylcellulose (REFRESH PLUS) 0.5 % SOLN Place 2 drops into both eyes 3 (three) times daily as needed.    .  cholecalciferol (VITAMIN D) 1000 UNITS tablet Take 1,000 Units by mouth daily.    Marland Kitchen diltiazem (CARDIZEM CD) 240 MG 24 hr capsule TAKE ONE CAPSULE BY MOUTH ONCE DAILY 30 capsule 11  . docusate sodium (COLACE) 100 MG capsule Take 100 mg by mouth every other day.    Marland Kitchen ELIQUIS 5 MG TABS tablet TAKE 1 TABLET (5 MG TOTAL) BY MOUTH 2 (TWO) TIMES DAILY. 60 tablet 6  . Flaxseed, Linseed, (FLAX SEEDS) POWD Take by mouth as directed.    . Multiple Vitamins-Minerals (CENTRUM ULTRA WOMENS) TABS Take 1 tablet by mouth daily.      No current facility-administered medications for this visit.    Allergies: Allergies  Allergen Reactions  . Other Other (See Comments)    Raw Almonds, itching   Peaches, itching  . Ampicillin Rash    Has taken amoxicillin without any reaction    Social History: The patient  reports that she has never smoked. She does not have any smokeless tobacco history on file. She reports that she drinks alcohol. She reports that she does not use illicit drugs.   Family History: The patient's family history includes Aneurysm in her mother; Atrial fibrillation in her father; CAD in her paternal uncle; Migraines in her sister; Peripheral vascular  disease in her mother; Stroke in her mother.   Review of Systems: Please see the history of present illness.   Otherwise, the review of systems is positive for none.   All other systems are reviewed and negative.   Physical Exam: VS:  BP 130/80 mmHg  Pulse 68  Ht 5\' 4"  (1.626 m)  Wt 194 lb (87.998 kg)  BMI 33.28 kg/m2 .  BMI Body mass index is 33.28 kg/(m^2).  Wt Readings from Last 3 Encounters:  03/29/15 194 lb (87.998 kg)  12/02/14 194 lb (87.998 kg)  10/05/14 194 lb 12.8 oz (88.361 kg)    General: Pleasant. She is obese. She is alert and in no acute distress.  HEENT: Normal. Neck: Supple, no JVD, carotid bruits, or masses noted.  Cardiac: Regular rate and rhythm. No murmurs, rubs, or gallops. No edema.  Respiratory:  Lungs are  clear to auscultation bilaterally with normal work of breathing.  GI: Soft and nontender.  MS: No deformity or atrophy. Gait and ROM intact. Skin: Warm and dry. Color is normal.  Neuro:  Strength and sensation are intact and no gross focal deficits noted.  Psych: Alert, appropriate and with normal affect.   LABORATORY DATA:  EKG:  EKG is not ordered today.  Lab Results  Component Value Date   WBC 7.1 10/05/2014   HGB 13.6 10/05/2014   HCT 40.9 10/05/2014   PLT 344.0 10/05/2014   GLUCOSE 79 10/05/2014   NA 140 10/05/2014   K 4.2 10/05/2014   CL 103 10/05/2014   CREATININE 0.99 10/05/2014   BUN 18 10/05/2014   CO2 30 10/05/2014   TSH 1.224 12/10/2012   INR 3.4 09/14/2013    BNP (last 3 results) No results for input(s): BNP in the last 8760 hours.  ProBNP (last 3 results) No results for input(s): PROBNP in the last 8760 hours.   Other Studies Reviewed Today:  Echo Study Conclusions from 2014  Left ventricle: The cavity size was normal. Wall thickness was normal. Systolic function was normal. The estimated ejection fraction was in the range of 60% to 65%. Wall motion was normal; there were no regional wall motion abnormalities.     Assessment/Plan: 1. PAF - with CHADSVASC of 2. No recurrence. She is doing well on her current regimen.   2. HTN - BP great on current regimen.   3. Obesity - needs to focus on weight reduction - her obesity is an added risk factor for having recurrent AF. She has joined a weight loss program. She is walking. Seems very motivated to make changes with her health.   4. Chronic anticoagulation - needs follow up labs today. No problems noted with Eliquis.   5. HLD - check labs today.   Current medicines are reviewed with the patient today.  The patient does not have concerns regarding medicines other than what has been noted above.  The following changes have been made:  See above.  Labs/ tests ordered today include:   No orders  of the defined types were placed in this encounter.     Disposition:   FU with me in 6 months.   Patient is agreeable to this plan and will call if any problems develop in the interim.   Signed: Burtis Junes, RN, ANP-C 03/29/2015 8:08 AM  [facility] [address] [place], [place]  [address] Phone: [phone] Fax: [phone]

## 2015-03-29 NOTE — Patient Instructions (Addendum)
We will be checking the following labs today - BMET, CBC, HPF, Lipids   Medication Instructions:    Continue with your current medicines.     Testing/Procedures To Be Arranged:  N/A  Follow-Up:   See me in 6 months.     Other Special Instructions:   Good luck with your exercise/weight loss plan!    If you need a refill on your cardiac medications before your next appointment, please call your pharmacy.   Call the [place] office at [phone] if you have any questions, problems or concerns.

## 2015-03-30 ENCOUNTER — Telehealth: Payer: Self-pay | Admitting: Nurse Practitioner

## 2015-03-30 NOTE — Telephone Encounter (Signed)
Returning your call from this morning, °

## 2015-04-27 DIAGNOSIS — M62830 Muscle spasm of back: Secondary | ICD-10-CM | POA: Diagnosis not present

## 2015-04-27 DIAGNOSIS — M5032 Other cervical disc degeneration, mid-cervical region, unspecified level: Secondary | ICD-10-CM | POA: Diagnosis not present

## 2015-04-27 DIAGNOSIS — M9901 Segmental and somatic dysfunction of cervical region: Secondary | ICD-10-CM | POA: Diagnosis not present

## 2015-04-27 DIAGNOSIS — M9902 Segmental and somatic dysfunction of thoracic region: Secondary | ICD-10-CM | POA: Diagnosis not present

## 2015-05-02 DIAGNOSIS — M5032 Other cervical disc degeneration, mid-cervical region, unspecified level: Secondary | ICD-10-CM | POA: Diagnosis not present

## 2015-05-02 DIAGNOSIS — M62830 Muscle spasm of back: Secondary | ICD-10-CM | POA: Diagnosis not present

## 2015-05-02 DIAGNOSIS — M9902 Segmental and somatic dysfunction of thoracic region: Secondary | ICD-10-CM | POA: Diagnosis not present

## 2015-05-02 DIAGNOSIS — M9901 Segmental and somatic dysfunction of cervical region: Secondary | ICD-10-CM | POA: Diagnosis not present

## 2015-05-09 DIAGNOSIS — M9901 Segmental and somatic dysfunction of cervical region: Secondary | ICD-10-CM | POA: Diagnosis not present

## 2015-05-09 DIAGNOSIS — M5032 Other cervical disc degeneration, mid-cervical region, unspecified level: Secondary | ICD-10-CM | POA: Diagnosis not present

## 2015-05-09 DIAGNOSIS — M9902 Segmental and somatic dysfunction of thoracic region: Secondary | ICD-10-CM | POA: Diagnosis not present

## 2015-05-09 DIAGNOSIS — M62830 Muscle spasm of back: Secondary | ICD-10-CM | POA: Diagnosis not present

## 2015-05-25 DIAGNOSIS — M9901 Segmental and somatic dysfunction of cervical region: Secondary | ICD-10-CM | POA: Diagnosis not present

## 2015-05-25 DIAGNOSIS — M62830 Muscle spasm of back: Secondary | ICD-10-CM | POA: Diagnosis not present

## 2015-05-25 DIAGNOSIS — M9902 Segmental and somatic dysfunction of thoracic region: Secondary | ICD-10-CM | POA: Diagnosis not present

## 2015-05-25 DIAGNOSIS — M5032 Other cervical disc degeneration, mid-cervical region, unspecified level: Secondary | ICD-10-CM | POA: Diagnosis not present

## 2015-06-01 ENCOUNTER — Other Ambulatory Visit: Payer: Self-pay | Admitting: *Deleted

## 2015-06-01 MED ORDER — APIXABAN 5 MG PO TABS: ORAL_TABLET | ORAL | Status: DC

## 2015-06-15 DIAGNOSIS — M9901 Segmental and somatic dysfunction of cervical region: Secondary | ICD-10-CM | POA: Diagnosis not present

## 2015-06-15 DIAGNOSIS — M9902 Segmental and somatic dysfunction of thoracic region: Secondary | ICD-10-CM | POA: Diagnosis not present

## 2015-06-15 DIAGNOSIS — M5032 Other cervical disc degeneration, mid-cervical region, unspecified level: Secondary | ICD-10-CM | POA: Diagnosis not present

## 2015-06-15 DIAGNOSIS — M62830 Muscle spasm of back: Secondary | ICD-10-CM | POA: Diagnosis not present

## 2015-06-28 ENCOUNTER — Other Ambulatory Visit: Payer: Self-pay | Admitting: *Deleted

## 2015-06-28 ENCOUNTER — Telehealth: Payer: Self-pay | Admitting: *Deleted

## 2015-06-28 MED ORDER — DILTIAZEM HCL ER COATED BEADS 120 MG PO CP24: 120.0000 mg | ORAL_CAPSULE | Freq: Every day | ORAL | Status: DC

## 2015-06-28 NOTE — Telephone Encounter (Signed)
-----   Message from Burtis Junes, NP sent at 06/28/2015 12:16 PM EDT ----- Regarding: RE: Change diltiazem Ok to cut back to 120 mg a day. Continue to monitor BP.  Cecille Rubin ----- Message -----    From: Tamsen Snider    Sent: 06/28/2015  10:53 AM      To: Burtis Junes, NP Subject: FW: Change diltiazem                             ----- Message -----    From: Dannette Barbara, CMA    Sent: 06/28/2015  10:21 AM      To: Tamsen Snider Subject: Change diltiazem                               Currently on 240mg  of diltiazem lost 22lbs and her bp is running 106/65 - 96/60 wants to know if she can have med decreased now. Would like to go back to 120mg .    [facility]

## 2015-06-28 NOTE — Telephone Encounter (Signed)
-----   Message from Burtis Junes, NP sent at 06/28/2015 12:16 PM EDT ----- Regarding: RE: Change diltiazem Ok to cut back to 120 mg a day. Continue to monitor BP.  Cecille Rubin ----- Message -----    From: Tamsen Snider    Sent: 06/28/2015  10:53 AM      To: Burtis Junes, NP Subject: FW: Change diltiazem                             ----- Message -----    From: Dannette Barbara, CMA    Sent: 06/28/2015  10:21 AM      To: Tamsen Snider Subject: Change diltiazem                               Currently on 240mg  of diltiazem lost 22lbs and her bp is running 106/65 - 96/60 wants to know if she can have med decreased now. Would like to go back to 120mg .    [facility]

## 2015-06-28 NOTE — Telephone Encounter (Signed)
S/w pt is aware that pt can switch dose of diltiazem to ( 120 mg ) daily x #90.  Sent in new script to pt's requested pharmacy. Pt started medfast and was getting very fatigued at night sleeping by 7:00 pm started taking bp in the pm  and bp was low ; 106/65 and 96/60. Continue to monitor bp

## 2015-07-27 DIAGNOSIS — R52 Pain, unspecified: Secondary | ICD-10-CM | POA: Diagnosis not present

## 2015-07-27 DIAGNOSIS — M65312 Trigger thumb, left thumb: Secondary | ICD-10-CM | POA: Diagnosis not present

## 2015-09-19 ENCOUNTER — Encounter: Payer: Self-pay | Admitting: Nurse Practitioner

## 2015-09-28 ENCOUNTER — Ambulatory Visit: Payer: BLUE CROSS/BLUE SHIELD | Admitting: Nurse Practitioner

## 2015-10-04 ENCOUNTER — Encounter: Payer: Self-pay | Admitting: Nurse Practitioner

## 2015-10-04 ENCOUNTER — Ambulatory Visit (INDEPENDENT_AMBULATORY_CARE_PROVIDER_SITE_OTHER): Payer: BLUE CROSS/BLUE SHIELD | Admitting: Nurse Practitioner

## 2015-10-04 VITALS — BP 130/80 | HR 69 | Ht 64.0 in | Wt 173.8 lb

## 2015-10-04 DIAGNOSIS — I480 Paroxysmal atrial fibrillation: Secondary | ICD-10-CM

## 2015-10-04 DIAGNOSIS — E785 Hyperlipidemia, unspecified: Secondary | ICD-10-CM | POA: Diagnosis not present

## 2015-10-04 DIAGNOSIS — Z7901 Long term (current) use of anticoagulants: Secondary | ICD-10-CM | POA: Diagnosis not present

## 2015-10-04 DIAGNOSIS — I119 Hypertensive heart disease without heart failure: Secondary | ICD-10-CM

## 2015-10-04 NOTE — Patient Instructions (Addendum)
We will be checking the following labs today - NONE   Medication Instructions:    Continue with your current medicines.  Ok to change the timing of your Diltiazem to night time    Testing/Procedures To Be Arranged:  N/A  Follow-Up:   See me in 6 months    Other Special Instructions:   Keep up the good work!!  BP diary - send me a message thru MyChart if readings are staying in the 120/85 or lower range    If you need a refill on your cardiac medications before your next appointment, please call your pharmacy.   Call the [place] office at [phone] if you have any questions, problems or concerns.

## 2015-10-04 NOTE — Progress Notes (Signed)
CARDIOLOGY OFFICE NOTE  Date:  10/04/2015    [name] Date of Birth: 06-07-1965 Medical Record [id number]  PCP:  Cari Caraway, MD  Cardiologist:  Servando Snare     Chief Complaint  Patient presents with  . Atrial Fibrillation  . Hypertension    Follow up visit - seen for Dr. Mare Ferrari    History of Present Illness: [name] is a 50 y.o. female who presents today for a 6 month check. Former patient of Dr. Sherryl Barters.    She has a history of PAF - seen in [name] back in November 2014 with paroxysmal atrial fibrillation. She converted on IV diltiazem. Her echocardiogram at that time was normal. Chadsvasc score was 2 for hypertension and female sex. She was started on warfarin and then switched to NOAC. Other issues include past anemia, HLD and HTN. Negative OSA by sleep study noted.   Last seen back in March - was doing well. Actively losing weight thru diet and exercise. She wanted to be on less medicine and was quite motivated to make changes with her lifestyle.   Comes in today. Here alone. Doing well. Down 21 pounds since last visit here. She did this thru [employer] the caffeine products. Walking 5 days a week. No problems noted. She feels better. Has more energy. BP is great. Some early evening fatigue noted - BP about [id number] systolic during this time. Still would like to come off her CCB. She is getting married next month - actually bought a dress that was 2 sizes too small which she can now fit into. No chest pain. Rhythm has been good. Very rare palpitations.  Past Medical History:  Diagnosis Date  . Anemia    In past - borderline anemia requiring iron  . Atrial fibrillation ([place])    a. Formal dx 12/10/12 - suspected paroxysmal.  . HTN (hypertension)    a. Formally dx 11/2012 - mild.  . Hypercholesterolemia    Borderline - not requiring medicines  . Migraines   . Seasonal allergies   . Snoring 12/03/2014    Past Surgical History:  Procedure  Laterality Date  . BREAST SURGERY    . egg donation     In her 7's.     Medications: Current Outpatient Prescriptions  Medication Sig Dispense Refill  . apixaban (ELIQUIS) 5 MG TABS tablet TAKE 1 TABLET (5 MG TOTAL) BY MOUTH 2 (TWO) TIMES DAILY. 60 tablet 9  . carboxymethylcellulose (REFRESH PLUS) 0.5 % SOLN Place 2 drops into both eyes 3 (three) times daily as needed.    . cholecalciferol (VITAMIN D) 1000 UNITS tablet Take 1,000 Units by mouth daily.    Marland Kitchen diltiazem (CARDIZEM CD) 120 MG 24 hr capsule Take 1 capsule (120 mg total) by mouth daily. 90 capsule 3  . docusate sodium (COLACE) 100 MG capsule Take 100 mg by mouth every other day.    . Multiple Vitamins-Minerals (ADULT GUMMY PO) Take 2 tablets by mouth daily.     No current facility-administered medications for this visit.     Allergies: Allergies  Allergen Reactions  . Other Other (See Comments)    Raw Almonds, itching   Peaches, itching  . Ampicillin Rash    Has taken amoxicillin without any reaction    Social History: The patient  reports that she has never smoked. She has never used smokeless tobacco. She reports that she drinks alcohol. She reports that she does not use drugs.   Family  History: The patient's family history includes Aneurysm in her mother; Atrial fibrillation in her father; CAD in her paternal uncle; Migraines in her sister; Peripheral vascular disease in her mother; Stroke in her mother.   Review of Systems: Please see the history of present illness.   Otherwise, the review of systems is positive for none.   All other systems are reviewed and negative.   Physical Exam: VS:  BP 130/80   Pulse 69   Ht 5\' 4"  (1.626 m)   Wt 173 lb 12.8 oz (78.8 kg)   BMI 29.83 kg/m  .  BMI Body mass index is 29.83 kg/m.  Wt Readings from Last 3 Encounters:  10/04/15 173 lb 12.8 oz (78.8 kg)  03/29/15 194 lb (88 kg)  12/02/14 194 lb (88 kg)    General: Pleasant. Well developed, well nourished and in no  acute distress. She has lost 21 pounds since last visit here with me.   HEENT: Normal.  Neck: Supple, no JVD, carotid bruits, or masses noted.  Cardiac: Regular rate and rhythm. No murmurs, rubs, or gallops. No edema.  Respiratory:  Lungs are clear to auscultation bilaterally with normal work of breathing.  GI: Soft and nontender.  MS: No deformity or atrophy. Gait and ROM intact.  Skin: Warm and dry. Color is normal.  Neuro:  Strength and sensation are intact and no gross focal deficits noted.  Psych: Alert, appropriate and with normal affect.   LABORATORY DATA:  EKG:  EKG is ordered today. This shows NSR  Lab Results  Component Value Date   WBC 7.9 03/29/2015   HGB 13.6 03/29/2015   HCT 40.6 03/29/2015   PLT 390 03/29/2015   GLUCOSE 94 03/29/2015   CHOL 215 (H) 03/29/2015   TRIG 106 03/29/2015   HDL 67 03/29/2015   LDLCALC 127 03/29/2015   ALT 14 03/29/2015   AST 16 03/29/2015   NA 136 03/29/2015   K 4.6 03/29/2015   CL 101 03/29/2015   CREATININE 1.07 (H) 03/29/2015   BUN 20 03/29/2015   CO2 25 03/29/2015   TSH 1.224 12/10/2012   INR 3.4 09/14/2013    BNP (last 3 results) No results for input(s): BNP in the last 8760 hours.  ProBNP (last 3 results) No results for input(s): PROBNP in the last 8760 hours.   Other Studies Reviewed Today:  Echo Study Conclusions from 2014  Left ventricle: The cavity size was normal. Wall thickness was normal. Systolic function was normal. The estimated ejection fraction was in the range of 60% to 65%. Wall motion was normal; there were no regional wall motion abnormalities.     Assessment/Plan: 1. PAF - with CHADSVASC of 2. No recurrence. She is doing well on her current regimen.   2. HTN - BP great on current regimen. Probably lower - she is agreeable to monitoring and may be able to get her off of this. She is going to switch to a night time dosing time due to some early evening fatigue.   3. Obesity - actively  losing weight and exercising - encouraged her to keep up the good work.   4. Chronic anticoagulation. No problems noted with Eliquis.   5. HLD - last lipids noted. She has her physical with PCP in November.    Current medicines are reviewed with the patient today.  The patient does not have concerns regarding medicines other than what has been noted above.  The following changes have been made:  See above.  Labs/ tests ordered today include:    Orders Placed This Encounter  Procedures  . EKG 12-Lead     Disposition:   FU with me in 6 months. She is going to send me a message thru MyChart if BP readings are really good - will give her a trial off of her medicine. May at some point be able to take her diagnosis of HTN away. She is quite motivated.   Patient is agreeable to this plan and will call if any problems develop in the interim.   Signed: Burtis Junes, RN, ANP-C 10/04/2015 3:09 PM  [facility] [address] [place], [place]  [address] Phone: [phone] Fax: [phone]

## 2015-10-10 ENCOUNTER — Other Ambulatory Visit: Payer: Self-pay | Admitting: Family Medicine

## 2015-10-10 DIAGNOSIS — Z1231 Encounter for screening mammogram for malignant neoplasm of breast: Secondary | ICD-10-CM

## 2015-11-14 ENCOUNTER — Encounter: Payer: Self-pay | Admitting: Radiology

## 2015-11-14 ENCOUNTER — Ambulatory Visit
Admission: RE | Admit: 2015-11-14 | Discharge: 2015-11-14 | Disposition: A | Discharge status: Home / Self Care | Payer: BLUE CROSS/BLUE SHIELD | Source: Ambulatory Visit | Attending: Family Medicine | Admitting: Family Medicine

## 2015-11-14 DIAGNOSIS — Z1231 Encounter for screening mammogram for malignant neoplasm of breast: Secondary | ICD-10-CM

## 2015-11-28 ENCOUNTER — Other Ambulatory Visit (HOSPITAL_COMMUNITY)
Admission: RE | Admit: 2015-11-28 | Discharge: 2015-11-28 | Disposition: A | Discharge status: Home / Self Care | Payer: BLUE CROSS/BLUE SHIELD | Source: Ambulatory Visit | Attending: Family Medicine | Admitting: Family Medicine

## 2015-11-28 ENCOUNTER — Other Ambulatory Visit: Payer: Self-pay | Admitting: Family Medicine

## 2015-11-28 DIAGNOSIS — Z0000 Encounter for general adult medical examination without abnormal findings: Secondary | ICD-10-CM | POA: Diagnosis not present

## 2015-11-28 DIAGNOSIS — Z1151 Encounter for screening for human papillomavirus (HPV): Secondary | ICD-10-CM | POA: Diagnosis not present

## 2015-11-28 DIAGNOSIS — Z5181 Encounter for therapeutic drug level monitoring: Secondary | ICD-10-CM | POA: Diagnosis not present

## 2015-11-28 DIAGNOSIS — E7800 Pure hypercholesterolemia, unspecified: Secondary | ICD-10-CM | POA: Diagnosis not present

## 2015-11-28 DIAGNOSIS — Z124 Encounter for screening for malignant neoplasm of cervix: Secondary | ICD-10-CM | POA: Diagnosis not present

## 2015-11-28 DIAGNOSIS — E049 Nontoxic goiter, unspecified: Secondary | ICD-10-CM

## 2015-11-28 DIAGNOSIS — L659 Nonscarring hair loss, unspecified: Secondary | ICD-10-CM | POA: Diagnosis not present

## 2015-11-30 LAB — CYTOLOGY - PAP
DIAGNOSIS: NEGATIVE
HPV: NOT DETECTED

## 2015-12-02 ENCOUNTER — Ambulatory Visit
Admission: RE | Admit: 2015-12-02 | Discharge: 2015-12-02 | Disposition: A | Discharge status: Home / Self Care | Payer: BLUE CROSS/BLUE SHIELD | Source: Ambulatory Visit | Attending: Family Medicine | Admitting: Family Medicine

## 2015-12-02 DIAGNOSIS — E041 Nontoxic single thyroid nodule: Secondary | ICD-10-CM | POA: Diagnosis not present

## 2015-12-02 DIAGNOSIS — E049 Nontoxic goiter, unspecified: Secondary | ICD-10-CM

## 2015-12-06 ENCOUNTER — Other Ambulatory Visit: Payer: Self-pay | Admitting: Family Medicine

## 2015-12-06 ENCOUNTER — Telehealth: Payer: Self-pay | Admitting: *Deleted

## 2015-12-06 DIAGNOSIS — E041 Nontoxic single thyroid nodule: Secondary | ICD-10-CM

## 2015-12-06 NOTE — Telephone Encounter (Signed)
[facility] called,  pt is getting a thyroid biopsy and wants to hold eliquis (5mg ) bid, 48 hours prior to procedure.  Truitt Merle, NP stated pt was in NSR last EKG and is ok to hold Eliquis. Will fax over to [phone] order.

## 2015-12-14 ENCOUNTER — Ambulatory Visit
Admission: RE | Admit: 2015-12-14 | Discharge: 2015-12-14 | Disposition: A | Discharge status: Home / Self Care | Payer: BLUE CROSS/BLUE SHIELD | Source: Ambulatory Visit | Attending: Family Medicine | Admitting: Family Medicine

## 2015-12-14 ENCOUNTER — Other Ambulatory Visit (HOSPITAL_COMMUNITY)
Admission: RE | Admit: 2015-12-14 | Discharge: 2015-12-14 | Disposition: A | Discharge status: Home / Self Care | Payer: BLUE CROSS/BLUE SHIELD | Source: Ambulatory Visit | Attending: General Surgery | Admitting: General Surgery

## 2015-12-14 DIAGNOSIS — E041 Nontoxic single thyroid nodule: Secondary | ICD-10-CM | POA: Insufficient documentation

## 2016-01-30 ENCOUNTER — Telehealth: Payer: Self-pay | Admitting: Nurse Practitioner

## 2016-01-30 DIAGNOSIS — E042 Nontoxic multinodular goiter: Secondary | ICD-10-CM | POA: Diagnosis not present

## 2016-01-30 NOTE — Telephone Encounter (Signed)
New message      Request for surgical clearance:  What type of surgery is being performed?  Biopsy on thyroid 1. When is this surgery scheduled?  Pending clearance  Are there any medications that need to be held prior to surgery and how long? Instructions for holding eliquis Name of physician performing surgery?  Dr Buddy Duty What is your office phone and fax number? Fax [phone]

## 2016-01-30 NOTE — Telephone Encounter (Signed)
lvm for [name] that pt has already been cleared for thyroid biopsy and hold eliquis (5mg  ) bid 48 hours prior to procedure.

## 2016-01-30 NOTE — Telephone Encounter (Signed)
TJ called back from Dr. Cindra Eves office, asked to refax order.  Pulled order from 11/14 and refaxed to [facility] @ [phone].

## 2016-01-31 ENCOUNTER — Other Ambulatory Visit: Payer: Self-pay | Admitting: Internal Medicine

## 2016-01-31 DIAGNOSIS — E041 Nontoxic single thyroid nodule: Secondary | ICD-10-CM

## 2016-02-10 ENCOUNTER — Other Ambulatory Visit: Payer: Self-pay | Admitting: Internal Medicine

## 2016-02-10 ENCOUNTER — Ambulatory Visit
Admission: RE | Admit: 2016-02-10 | Discharge: 2016-02-10 | Disposition: A | Discharge status: Home / Self Care | Payer: BLUE CROSS/BLUE SHIELD | Source: Ambulatory Visit | Attending: Internal Medicine | Admitting: Internal Medicine

## 2016-02-10 DIAGNOSIS — E041 Nontoxic single thyroid nodule: Secondary | ICD-10-CM

## 2016-02-10 DIAGNOSIS — E042 Nontoxic multinodular goiter: Secondary | ICD-10-CM | POA: Diagnosis not present

## 2016-03-21 ENCOUNTER — Other Ambulatory Visit: Payer: Self-pay | Admitting: Nurse Practitioner

## 2016-03-21 NOTE — Telephone Encounter (Signed)
Age 51 Saw Truitt Merle NP on 10/04/2015  03/29/2015 Hgb 13.6 HCT 40.6 03/29/2015 SrCr 1.07 Wt 78.8kg 10/04/2015  Refill done for Eliquis 5mg  q 12 hours

## 2016-04-04 ENCOUNTER — Ambulatory Visit: Payer: BLUE CROSS/BLUE SHIELD | Admitting: Nurse Practitioner

## 2016-05-01 NOTE — Progress Notes (Signed)
CARDIOLOGY OFFICE NOTE  Date:  05/02/2016    [name] Augustin [name]) Date of Birth: 11-10-1965 Medical Record #[id number]  PCP:  Cari Caraway, MD  Cardiologist:  Servando Snare   Chief Complaint  Patient presents with  . Atrial Fibrillation    6 month check     History of Present Illness: [name]) is a 51 y.o. female who presents today for a follow up visit. Former patient of Dr. Sherryl Barters. She follows with me.   She has a history of PAF - seen in [facility] back in November 2014 with paroxysmal atrial fibrillation. She converted on IV diltiazem. Her echocardiogram at that time was normal. Chadsvasc score was 2 for hypertension and female sex. She was started on warfarin and then switched to NOAC. Other issues include past anemia, obesity, HLD and HTN. Negative OSA by sleep study noted.   Last seen back in September - was doing well. Actively losing weight thru diet and exercise. Doing well from our standpoint. She was planning on getting married and had actually bought a dress that was 2 sizes too small which she could now fit into.  Comes in today. Here alone. She has gotten married as of last October. Doing well. She has gained some weight back. She is now working from home - this has really impacted her exercise program but she hopes to get back to walking now that the weather is improving. She is feeling good. Rhythm has been ok. She has left her medicines alone - did not titrate anything down. BP is fine. Saw PCP back in the fall. Other than her weight gain back, she is happy with how she is doing.   Past Medical History:  Diagnosis Date  . Anemia    In past - borderline anemia requiring iron  . Atrial fibrillation ([place])    a. Formal dx 12/10/12 - suspected paroxysmal.  . HTN (hypertension)    a. Formally dx 11/2012 - mild.  . Hypercholesterolemia    Borderline - not requiring medicines  . Migraines   . Seasonal allergies   . Snoring 12/03/2014     Past Surgical History:  Procedure Laterality Date  . BREAST SURGERY    . egg donation     In her 91's.     Medications: Current Outpatient Prescriptions  Medication Sig Dispense Refill  . carboxymethylcellulose (REFRESH PLUS) 0.5 % SOLN Place 2 drops into both eyes 3 (three) times daily as needed.    . cholecalciferol (VITAMIN D) 1000 UNITS tablet Take 1,000 Units by mouth daily.    Marland Kitchen diltiazem (CARDIZEM CD) 120 MG 24 hr capsule Take 1 capsule (120 mg total) by mouth daily. 90 capsule 3  . docusate sodium (COLACE) 100 MG capsule Take 100 mg by mouth every other day.    Marland Kitchen ELIQUIS 5 MG TABS tablet TAKE 1 TABLET BY MOUTH TWICE A DAY 60 tablet 5  . Multiple Vitamins-Minerals (ADULT GUMMY PO) Take 2 tablets by mouth daily.     No current facility-administered medications for this visit.     Allergies: Allergies  Allergen Reactions  . Other Other (See Comments)    Raw Almonds, itching   Peaches, itching  . Ampicillin Rash    Has taken amoxicillin without any reaction    Social History: The patient  reports that she has never smoked. She has never used smokeless tobacco. She reports that she drinks alcohol. She reports that she does not use drugs.  Family History: The patient's family history includes Aneurysm in her mother; Atrial fibrillation in her father; CAD in her paternal uncle; Migraines in her sister; Peripheral vascular disease in her mother; Stroke in her mother.   Review of Systems: Please see the history of present illness.   Otherwise, the review of systems is positive for none.   All other systems are reviewed and negative.   Physical Exam: VS:  BP 130/72   Pulse 82   Ht 5\' 4"  (1.626 m)   Wt 186 lb 12.8 oz (84.7 kg)   LMP 10/01/2012   SpO2 99% Comment: at rest  BMI 32.06 kg/m  .  BMI Body mass index is 32.06 kg/m.  Wt Readings from Last 3 Encounters:  05/02/16 186 lb 12.8 oz (84.7 kg)  10/04/15 173 lb 12.8 oz (78.8 kg)  03/29/15 194 lb (88 kg)     General: Pleasant. Well developed, well nourished and in no acute distress. She has gained weight.   HEENT: Normal.  Neck: Supple, no JVD, carotid bruits, or masses noted.  Cardiac: Regular rate and rhythm. No murmurs, rubs, or gallops. No edema.  Respiratory:  Lungs are clear to auscultation bilaterally with normal work of breathing.  GI: Soft and nontender.  MS: No deformity or atrophy. Gait and ROM intact.  Skin: Warm and dry. Color is normal.  Neuro:  Strength and sensation are intact and no gross focal deficits noted.  Psych: Alert, appropriate and with normal affect.   LABORATORY DATA:  EKG:  EKG is not ordered today.  Lab Results  Component Value Date   WBC 7.9 03/29/2015   HGB 13.6 03/29/2015   HCT 40.6 03/29/2015   PLT 390 03/29/2015   GLUCOSE 94 03/29/2015   CHOL 215 (H) 03/29/2015   TRIG 106 03/29/2015   HDL 67 03/29/2015   LDLCALC 127 03/29/2015   ALT 14 03/29/2015   AST 16 03/29/2015   NA 136 03/29/2015   K 4.6 03/29/2015   CL 101 03/29/2015   CREATININE 1.07 (H) 03/29/2015   BUN 20 03/29/2015   CO2 25 03/29/2015   TSH 1.224 12/10/2012   INR 3.4 09/14/2013    BNP (last 3 results) No results for input(s): BNP in the last 8760 hours.  ProBNP (last 3 results) No results for input(s): PROBNP in the last 8760 hours.   Other Studies Reviewed Today:  Echo Study Conclusions from 2014  Left ventricle: The cavity size was normal. Wall thickness was normal. Systolic function was normal. The estimated ejection fraction was in the range of 60% to 65%. Wall motion was normal; there were no regional wall motion abnormalities.     Assessment/Plan:  1. PAF - with CHADSVASC of 2. No recurrence. She is doing well on her current regimen.   2. HTN - BP great on current regimen. I have left her on her current regimen. Would favor continuing this regimen unless she is successful again with weight loss - then we could entertain cutting medicines back.    3. Obesity - encouraged her to get back on track.   4. Chronic anticoagulation. No problems noted with Eliquis. Baseline labs today.   Current medicines are reviewed with the patient today.  The patient does not have concerns regarding medicines other than what has been noted above.  The following changes have been made:  See above.  Labs/ tests ordered today include:    Orders Placed This Encounter  Procedures  . Basic metabolic panel  . CBC  Disposition:   FU with me in 6 months.   Patient is agreeable to this plan and will call if any problems develop in the interim.   SignedTruitt Merle, NP  05/02/2016 3:55 PM  [facility] [address] [place], [place]  [address] Phone: [phone] Fax: [phone]

## 2016-05-02 ENCOUNTER — Encounter: Payer: Self-pay | Admitting: Nurse Practitioner

## 2016-05-02 ENCOUNTER — Ambulatory Visit (INDEPENDENT_AMBULATORY_CARE_PROVIDER_SITE_OTHER): Payer: BLUE CROSS/BLUE SHIELD | Admitting: Nurse Practitioner

## 2016-05-02 VITALS — BP 130/72 | HR 82 | Ht 64.0 in | Wt 186.8 lb

## 2016-05-02 DIAGNOSIS — I480 Paroxysmal atrial fibrillation: Secondary | ICD-10-CM

## 2016-05-02 DIAGNOSIS — I119 Hypertensive heart disease without heart failure: Secondary | ICD-10-CM

## 2016-05-02 DIAGNOSIS — Z7901 Long term (current) use of anticoagulants: Secondary | ICD-10-CM | POA: Diagnosis not present

## 2016-05-02 NOTE — Patient Instructions (Addendum)
We will be checking the following labs today - BMET and CBC   Medication Instructions:    Continue with your current medicines.      Testing/Procedures To Be Arranged:  N/A  Follow-Up:   See me in 6 months    Other Special Instructions:   N/A    If you need a refill on your cardiac medications before your next appointment, please call your pharmacy.   Call the [facility] office at [phone] if you have any questions, problems or concerns.      

## 2016-05-03 LAB — CBC
Hematocrit: 40.4 % (ref 34.0–46.6)
Hemoglobin: 13.2 g/dL (ref 11.1–15.9)
MCH: 29.9 pg (ref 26.6–33.0)
MCHC: 32.7 g/dL (ref 31.5–35.7)
MCV: 91 fL (ref 79–97)
Platelets: 370 10*3/uL (ref 150–379)
RBC: 4.42 x10E6/uL (ref 3.77–5.28)
RDW: 13.5 % (ref 12.3–15.4)
WBC: 7.7 10*3/uL (ref 3.4–10.8)

## 2016-05-03 LAB — BASIC METABOLIC PANEL
BUN/Creatinine Ratio: 17 (ref 9–23)
BUN: 16 mg/dL (ref 6–24)
CO2: 27 mmol/L (ref 18–29)
Calcium: 9.8 mg/dL (ref 8.7–10.2)
Chloride: 100 mmol/L (ref 96–106)
Creatinine, Ser: 0.94 mg/dL (ref 0.57–1.00)
GFR calc Af Amer: 81 mL/min/{1.73_m2} (ref >59)
GFR calc non Af Amer: 70 mL/min/{1.73_m2} (ref >59)
Glucose: 91 mg/dL (ref 65–99)
Potassium: 4.3 mmol/L (ref 3.5–5.2)
Sodium: 138 mmol/L (ref 134–144)

## 2016-06-05 DIAGNOSIS — H52223 Regular astigmatism, bilateral: Secondary | ICD-10-CM | POA: Diagnosis not present

## 2016-06-13 DIAGNOSIS — E049 Nontoxic goiter, unspecified: Secondary | ICD-10-CM | POA: Diagnosis not present

## 2016-06-13 DIAGNOSIS — R5383 Other fatigue: Secondary | ICD-10-CM | POA: Diagnosis not present

## 2016-06-17 ENCOUNTER — Other Ambulatory Visit: Payer: Self-pay | Admitting: Nurse Practitioner

## 2016-09-24 ENCOUNTER — Other Ambulatory Visit: Payer: Self-pay | Admitting: Nurse Practitioner

## 2016-10-19 ENCOUNTER — Encounter: Payer: Self-pay | Admitting: Nurse Practitioner

## 2016-10-23 ENCOUNTER — Other Ambulatory Visit: Payer: Self-pay | Admitting: Family Medicine

## 2016-10-23 DIAGNOSIS — Z1231 Encounter for screening mammogram for malignant neoplasm of breast: Secondary | ICD-10-CM

## 2016-10-31 ENCOUNTER — Ambulatory Visit: Payer: BLUE CROSS/BLUE SHIELD | Admitting: Nurse Practitioner

## 2016-11-07 ENCOUNTER — Ambulatory Visit: Payer: BLUE CROSS/BLUE SHIELD | Admitting: Nurse Practitioner

## 2016-12-05 ENCOUNTER — Ambulatory Visit
Admission: RE | Admit: 2016-12-05 | Discharge: 2016-12-05 | Disposition: A | Discharge status: Home / Self Care | Payer: BLUE CROSS/BLUE SHIELD | Source: Ambulatory Visit | Attending: Family Medicine | Admitting: Family Medicine

## 2016-12-05 DIAGNOSIS — Z1231 Encounter for screening mammogram for malignant neoplasm of breast: Secondary | ICD-10-CM | POA: Diagnosis not present

## 2016-12-06 ENCOUNTER — Ambulatory Visit: Payer: BLUE CROSS/BLUE SHIELD

## 2016-12-10 ENCOUNTER — Ambulatory Visit: Payer: BLUE CROSS/BLUE SHIELD | Admitting: Nurse Practitioner

## 2016-12-10 NOTE — Progress Notes (Deleted)
CARDIOLOGY OFFICE NOTE  Date:  12/10/2016    [name] Date of Birth: 1965-11-03 Medical Record #[id number]  PCP:  Cari Caraway, MD  Cardiologist:  Servando Snare & ***    No chief complaint on file.   History of Present Illness: [name] is a 51 y.o. female who presents today for a ***   Comes in today. Here with   Past Medical History:  Diagnosis Date  . Anemia    In past - borderline anemia requiring iron  . Atrial fibrillation ([place])    a. Formal dx 12/10/12 - suspected paroxysmal.  . HTN (hypertension)    a. Formally dx 11/2012 - mild.  . Hypercholesterolemia    Borderline - not requiring medicines  . Migraines   . Seasonal allergies   . Snoring 12/03/2014    Past Surgical History:  Procedure Laterality Date  . AUGMENTATION MAMMAPLASTY Bilateral   . BREAST SURGERY    . egg donation     In her 55's.     Medications: No outpatient medications have been marked as taking for the 12/10/16 encounter (Appointment) with Burtis Junes, NP.     Allergies: Allergies  Allergen Reactions  . Other Other (See Comments)    Raw Almonds, itching   Peaches, itching  . Ampicillin Rash    Has taken amoxicillin without any reaction    Social History: The patient  reports that  has never smoked. she has never used smokeless tobacco. She reports that she drinks alcohol. She reports that she does not use drugs.   Family History: The patient's ***family history includes Aneurysm in her mother; Atrial fibrillation in her father and unknown relative; Breast cancer in her cousin and maternal grandmother; CAD in her paternal uncle; Migraines in her sister; Peripheral vascular disease in her mother; Stroke in her mother.   Review of Systems: Please see the history of present illness.   Otherwise, the review of systems is positive for {NONE DEFAULTED:18576::"none"}.   All other systems are reviewed and negative.   Physical Exam: VS:  LMP 10/01/2012  .  BMI There  is no height or weight on file to calculate BMI.  Wt Readings from Last 3 Encounters:  05/02/16 186 lb 12.8 oz (84.7 kg)  10/04/15 173 lb 12.8 oz (78.8 kg)  03/29/15 194 lb (88 kg)    General: Pleasant. Well developed, well nourished and in no acute distress.   HEENT: Normal.  Neck: Supple, no JVD, carotid bruits, or masses noted.  Cardiac: ***Regular rate and rhythm. No murmurs, rubs, or gallops. No edema.  Respiratory:  Lungs are clear to auscultation bilaterally with normal work of breathing.  GI: Soft and nontender.  MS: No deformity or atrophy. Gait and ROM intact.  Skin: Warm and dry. Color is normal.  Neuro:  Strength and sensation are intact and no gross focal deficits noted.  Psych: Alert, appropriate and with normal affect.   LABORATORY DATA:  EKG:  EKG {ACTION; IS/IS GGY:[id number]} ordered today. This demonstrates ***.  Lab Results  Component Value Date   WBC 7.7 05/02/2016   HGB 13.2 05/02/2016   HCT 40.4 05/02/2016   PLT 370 05/02/2016   GLUCOSE 91 05/02/2016   CHOL 215 (H) 03/29/2015   TRIG 106 03/29/2015   HDL 67 03/29/2015   LDLCALC 127 03/29/2015   ALT 14 03/29/2015   AST 16 03/29/2015   NA 138 05/02/2016   K 4.3 05/02/2016   CL 100 05/02/2016  CREATININE 0.94 05/02/2016   BUN 16 05/02/2016   CO2 27 05/02/2016   TSH 1.224 12/10/2012   INR 3.4 09/14/2013     BNP (last 3 results) No results for input(s): BNP in the last 8760 hours.  ProBNP (last 3 results) No results for input(s): PROBNP in the last 8760 hours.   Other Studies Reviewed Today:   Assessment/Plan:   Current medicines are reviewed with the patient today.  The patient does not have concerns regarding medicines other than what has been noted above.  The following changes have been made:  See above.  Labs/ tests ordered today include:   No orders of the defined types were placed in this encounter.    Disposition:   FU with *** in {gen number 2-35:573220} {Days to  years:10300}.   Patient is agreeable to this plan and will call if any problems develop in the interim.   SignedTruitt Merle, NP  12/10/2016 7:30 AM  [facility] [address] [place], [place]  [address] Phone: [phone] Fax: [phone]

## 2016-12-13 ENCOUNTER — Other Ambulatory Visit: Payer: Self-pay | Admitting: Nurse Practitioner

## 2016-12-19 DIAGNOSIS — Z0001 Encounter for general adult medical examination with abnormal findings: Secondary | ICD-10-CM | POA: Diagnosis not present

## 2016-12-19 DIAGNOSIS — I480 Paroxysmal atrial fibrillation: Secondary | ICD-10-CM | POA: Diagnosis not present

## 2016-12-19 DIAGNOSIS — I10 Essential (primary) hypertension: Secondary | ICD-10-CM | POA: Diagnosis not present

## 2016-12-19 DIAGNOSIS — Z23 Encounter for immunization: Secondary | ICD-10-CM | POA: Diagnosis not present

## 2017-01-07 DIAGNOSIS — E7800 Pure hypercholesterolemia, unspecified: Secondary | ICD-10-CM | POA: Diagnosis not present

## 2017-01-07 DIAGNOSIS — I10 Essential (primary) hypertension: Secondary | ICD-10-CM | POA: Diagnosis not present

## 2017-01-28 ENCOUNTER — Encounter: Payer: Self-pay | Admitting: Nurse Practitioner

## 2017-01-28 ENCOUNTER — Ambulatory Visit: Payer: BLUE CROSS/BLUE SHIELD | Admitting: Nurse Practitioner

## 2017-01-28 VITALS — BP 130/80 | HR 74 | Ht 64.0 in | Wt 207.0 lb

## 2017-01-28 DIAGNOSIS — I480 Paroxysmal atrial fibrillation: Secondary | ICD-10-CM | POA: Diagnosis not present

## 2017-01-28 DIAGNOSIS — I119 Hypertensive heart disease without heart failure: Secondary | ICD-10-CM | POA: Diagnosis not present

## 2017-01-28 DIAGNOSIS — E7849 Other hyperlipidemia: Secondary | ICD-10-CM | POA: Diagnosis not present

## 2017-01-28 DIAGNOSIS — Z7901 Long term (current) use of anticoagulants: Secondary | ICD-10-CM | POA: Diagnosis not present

## 2017-01-28 NOTE — Patient Instructions (Addendum)
We will be checking the following labs today - NONE   Medication Instructions:    Continue with your current medicines.     Testing/Procedures To Be Arranged:  N/A  Follow-Up:   See me in 6 months    Other Special Instructions:   Think about what we talked about today.     If you need a refill on your cardiac medications before your next appointment, please call your pharmacy.   Call the [facility] office at [phone] if you have any questions, problems or concerns.      

## 2017-01-28 NOTE — Progress Notes (Signed)
CARDIOLOGY OFFICE NOTE  Date:  01/28/2017    Elaine [name] Date of Birth: Aug 28, 1965 Medical Record #[id number]  PCP:  Cari Caraway, MD  Cardiologist:  Servando Snare   Chief Complaint  Patient presents with  . Atrial Fibrillation    6 month check.     History of Present Illness: Elaine [name] is a 52 y.o. female who presents today for a 6 month check. Former patient of Dr. Sherryl Barters. She primarily follows with me.   She has a history of PAF - seen in [facility] back in November 2014 with paroxysmal atrial fibrillation. She converted on IV diltiazem. Her echocardiogram at that time was normal. Chadsvasc score was 2 for hypertension and female sex. She was started on warfarin and then switched to NOAC. Other issues include past anemia, obesity, HLD and HTN. Negative OSA by sleep study noted.   Last seen back in April. Had gotten married and had previously been really successful with losing weight (bought a dress that was 2 sizes too small).  She was doing well. Had started working from home and this had really impacted her ability to exercise and her weight had gone back up.   Comes in today. Here alone. Her weight is way up. Lots of stress. Her dad was sick and she was traveling - lots of fast food. No exercise. She has a sibling who is not really helping much. Her CCB has been recently increased by her PCP due to breakthru AF - this has helped. She has had severe constipation on the higher doses. She is now on cherry extract to help with her joints/tingling in feet.   Past Medical History:  Diagnosis Date  . Anemia    In past - borderline anemia requiring iron  . Atrial fibrillation ([place])    a. Formal dx 12/10/12 - suspected paroxysmal.  . HTN (hypertension)    a. Formally dx 11/2012 - mild.  . Hypercholesterolemia    Borderline - not requiring medicines  . Migraines   . Seasonal allergies   . Snoring 12/03/2014    Past Surgical History:  Procedure Laterality  Date  . AUGMENTATION MAMMAPLASTY Bilateral   . BREAST SURGERY    . egg donation     In her 58's.     Medications: Current Meds  Medication Sig  . carboxymethylcellulose (REFRESH PLUS) 0.5 % SOLN Place 2 drops into both eyes 3 (three) times daily as needed.  . cholecalciferol (VITAMIN D) 1000 UNITS tablet Take 1,000 Units by mouth daily.  Marland Kitchen diltiazem (TIAZAC) 180 MG 24 hr capsule Take 180 mg by mouth daily.   Marland Kitchen docusate sodium (COLACE) 100 MG capsule Take 100 mg by mouth every other day.  Marland Kitchen ELIQUIS 5 MG TABS tablet TAKE 1 TABLET BY MOUTH TWICE A DAY  . Misc Natural Products (TART CHERRY ADVANCED PO) Take 1 capsule by mouth daily.  . Multiple Vitamins-Minerals (ADULT GUMMY PO) Take 2 tablets by mouth daily.     Allergies: Allergies  Allergen Reactions  . Other Other (See Comments)    Raw Almonds, itching   Peaches, itching  . Ampicillin Rash    Has taken amoxicillin without any reaction    Social History: The patient  reports that  has never smoked. she has never used smokeless tobacco. She reports that she drinks alcohol. She reports that she does not use drugs.   Family History: The patient's family history includes Aneurysm in her mother; Atrial fibrillation in  her father and unknown relative; Breast cancer in her cousin and maternal grandmother; CAD in her paternal uncle; Migraines in her sister; Peripheral vascular disease in her mother; Stroke in her mother.   Review of Systems: Please see the history of present illness.   Otherwise, the review of systems is positive for none.   All other systems are reviewed and negative.   Physical Exam: VS:  BP 130/80 (BP Location: Left Arm, Patient Position: Sitting, Cuff Size: Large)   Pulse 74   Ht 5\' 4"  (1.626 m)   Wt 207 lb (93.9 kg)   LMP 10/01/2012   BMI 35.53 kg/m  .  BMI Body mass index is 35.53 kg/m.  Wt Readings from Last 3 Encounters:  01/28/17 207 lb (93.9 kg)  05/02/16 186 lb 12.8 oz (84.7 kg)  10/04/15 173 lb  12.8 oz (78.8 kg)    General: Pleasant. Alert and in no acute distress. She gained 21 pounds since her last visit.  HEENT: Normal.  Neck: Supple, no JVD, carotid bruits, or masses noted.  Cardiac: Regular rate and rhythm. No murmurs, rubs, or gallops. No edema.  Respiratory:  Lungs are clear to auscultation bilaterally with normal work of breathing.  GI: Soft and nontender.  MS: No deformity or atrophy. Gait and ROM intact.  Skin: Warm and dry. Color is normal.  Neuro:  Strength and sensation are intact and no gross focal deficits noted.  Psych: Alert, appropriate and with normal affect.   LABORATORY DATA:  EKG:  EKG is ordered today. This shows NSR.   Lab Results  Component Value Date   WBC 7.7 05/02/2016   HGB 13.2 05/02/2016   HCT 40.4 05/02/2016   PLT 370 05/02/2016   GLUCOSE 91 05/02/2016   CHOL 215 (H) 03/29/2015   TRIG 106 03/29/2015   HDL 67 03/29/2015   LDLCALC 127 03/29/2015   ALT 14 03/29/2015   AST 16 03/29/2015   NA 138 05/02/2016   K 4.3 05/02/2016   CL 100 05/02/2016   CREATININE 0.94 05/02/2016   BUN 16 05/02/2016   CO2 27 05/02/2016   TSH 1.224 12/10/2012   INR 3.4 09/14/2013       BNP (last 3 results) No results for input(s): BNP in the last 8760 hours.  ProBNP (last 3 results) No results for input(s): PROBNP in the last 8760 hours.   Other Studies Reviewed Today:  Echo Study Conclusions from 2014  Left ventricle: The cavity size was normal. Wall thickness was normal. Systolic function was normal. The estimated ejection fraction was in the range of 60% to 65%. Wall motion was normal; there were no regional wall motion abnormalities.     Assessment/Plan:  1. PAF - with CHADSVASC of 2. CCB has recently been increased due to fleeting breakthru arrhythmia - this has helped. I suspect most of this has been triggered by stress and poor lifestyle.   2. HTN - BP ok on her current regimen. No changes made today  3. Obesity -  encouraged her to get back on track. Long discussion today.   4. Chronic anticoagulation. No problems noted with Eliquis. She has had her surveillance labs last month - these are reviewed.   5. Situational stress - she is in the caregiver role - discussed at length today and gave some tips on how to get her sibling involved.   Current medicines are reviewed with the patient today.  The patient does not have concerns regarding medicines other than what has  been noted above.  The following changes have been made:  See above.  Labs/ tests ordered today include:    Orders Placed This Encounter  Procedures  . EKG 12-Lead     Disposition:   FU with me in 6 months.   Patient is agreeable to this plan and will call if any problems develop in the interim.   SignedTruitt Merle, NP  01/28/2017 11:21 AM  [place] [address] [place], [place]  [address] Phone: [phone] Fax: [phone]

## 2017-02-12 IMAGING — US US THYROID BIOPSY
1 series · 13 of 18 positions shown · non-contrast
Comparison: Ultrasound on December 02, 2015

MEDICATIONS:
1% lidocaine

COMPLICATIONS:
None immediate.

INDICATION: Indeterminate thyroid nodule within the inferior right pole of the
thyroid.

EXAM:
ULTRASOUND GUIDED FINE NEEDLE ASPIRATION OF INDETERMINATE THYROID
NODULE
TECHNIQUE: Informed written consent was obtained from the patient after a
discussion of the risks, benefits and alternatives to treatment.
Questions regarding the procedure were encouraged and answered. A
timeout was performed prior to the initiation of the procedure.

[Series 1: us thyroid biopsy · 0.06mm/px · 18 acquisitions, 13 frames shown]
[im 1/18]
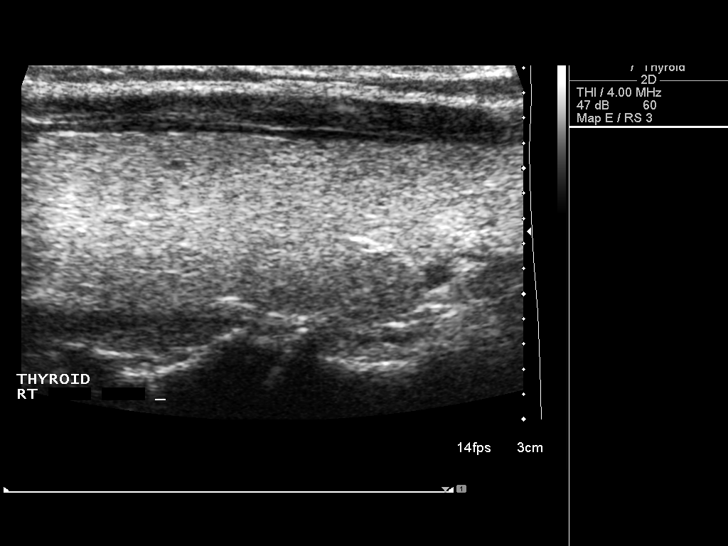
[im 3/18]
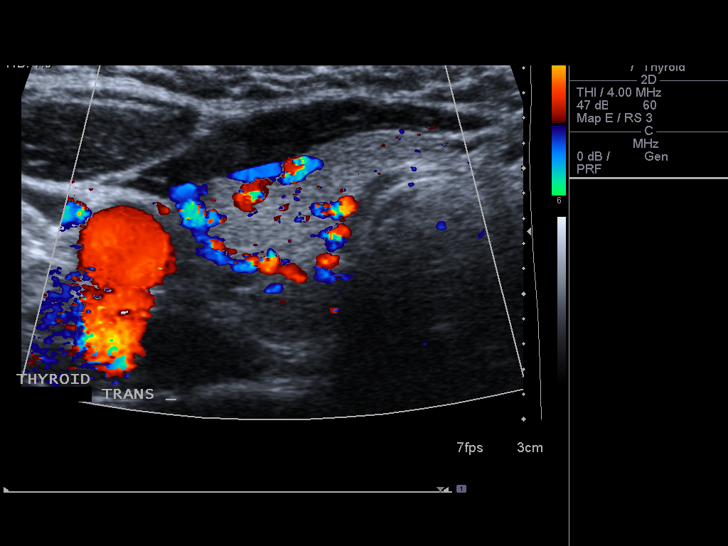
[im 4/18]
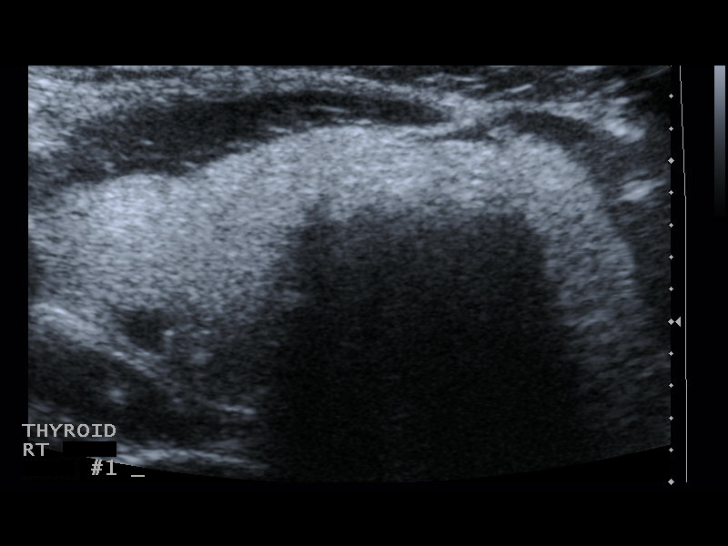
[im 5/18]
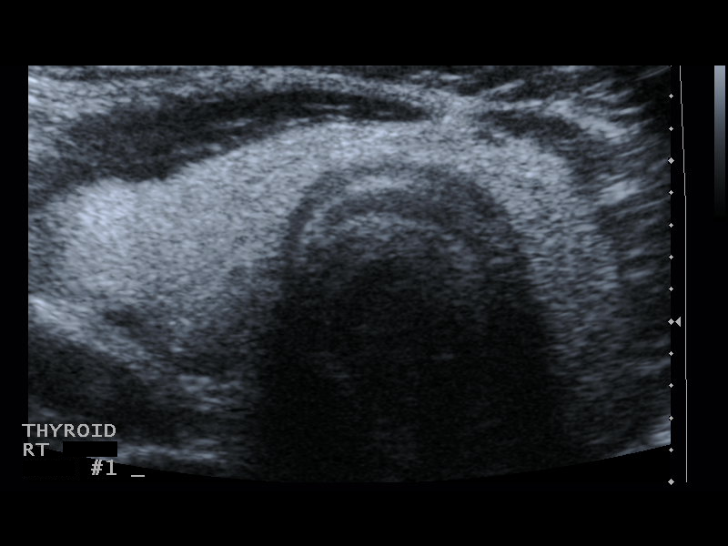
[im 7/18]
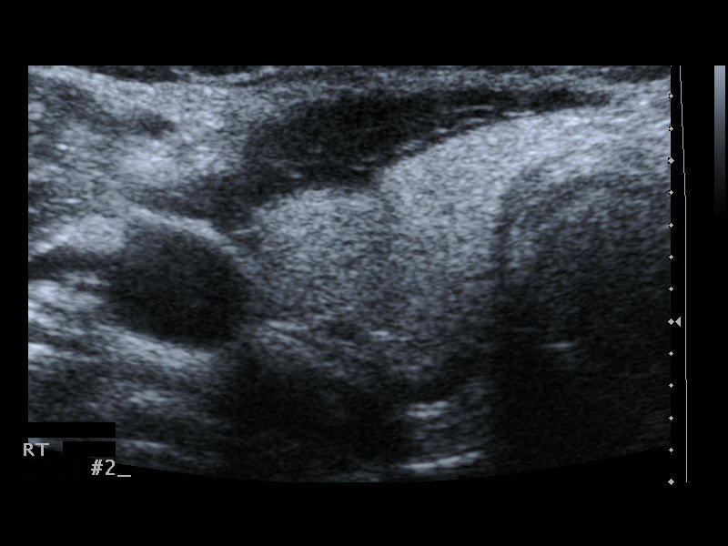
[im 8/18]
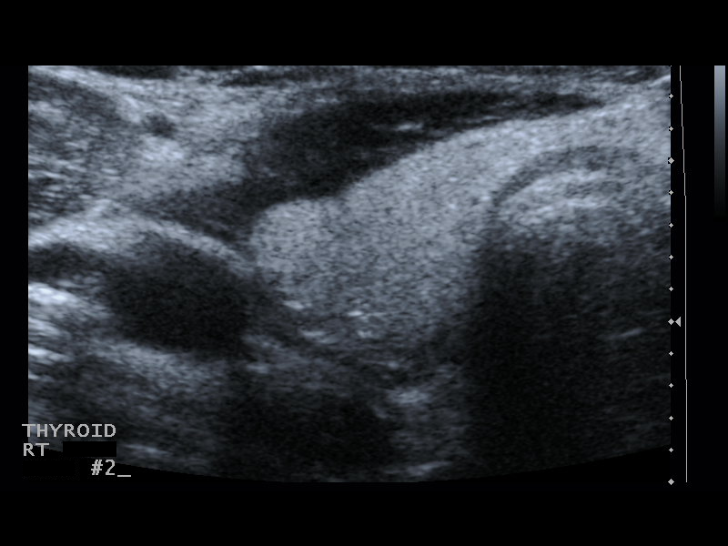
[im 10/18]
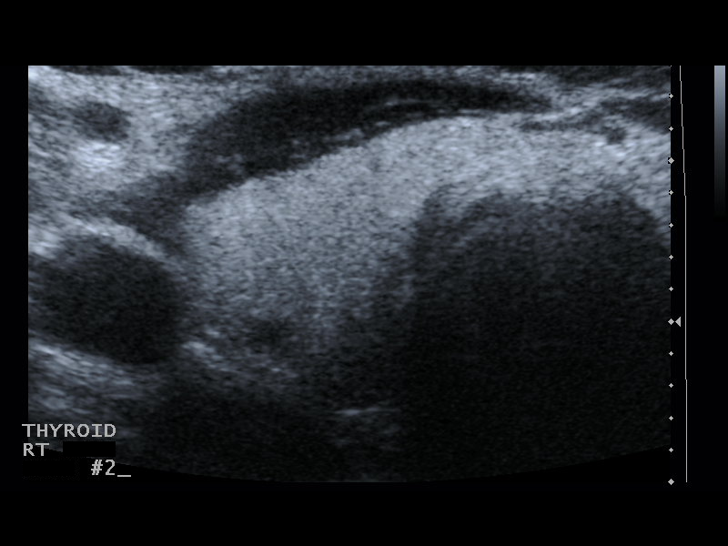
[im 11/18]
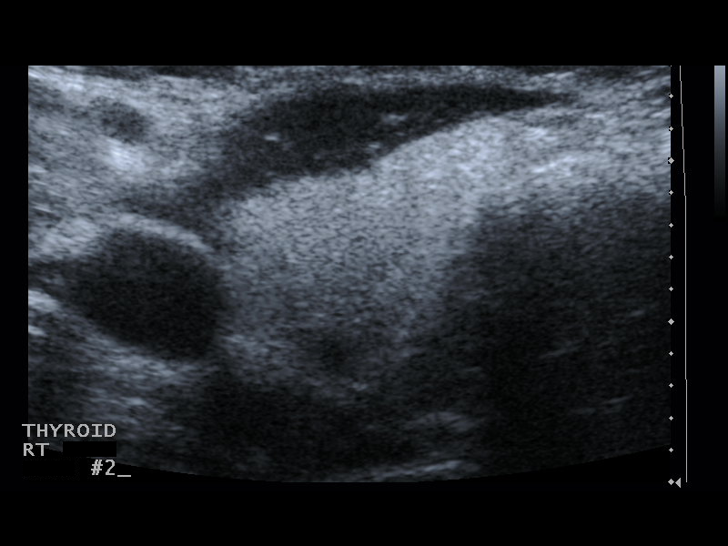
[im 12/18]
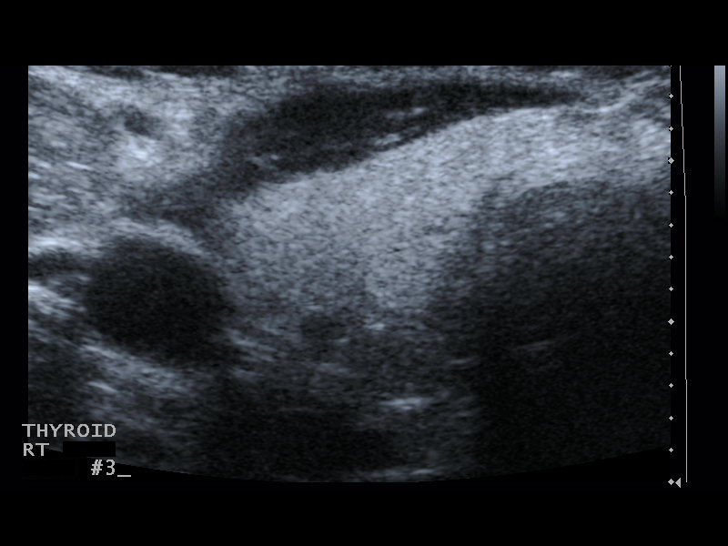
[im 14/18]
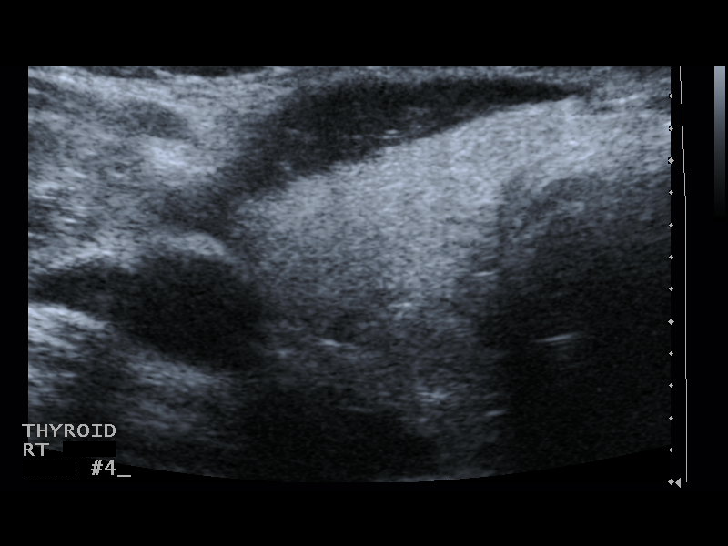
[im 15/18]
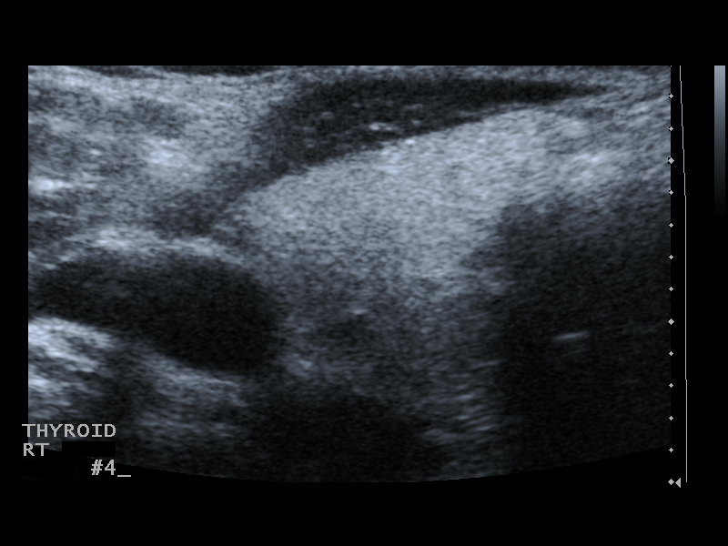
[im 16/18]
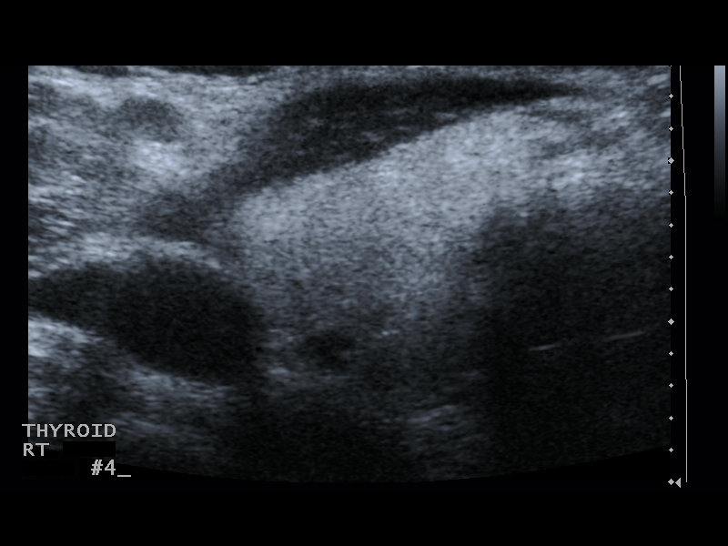
[im 18/18]
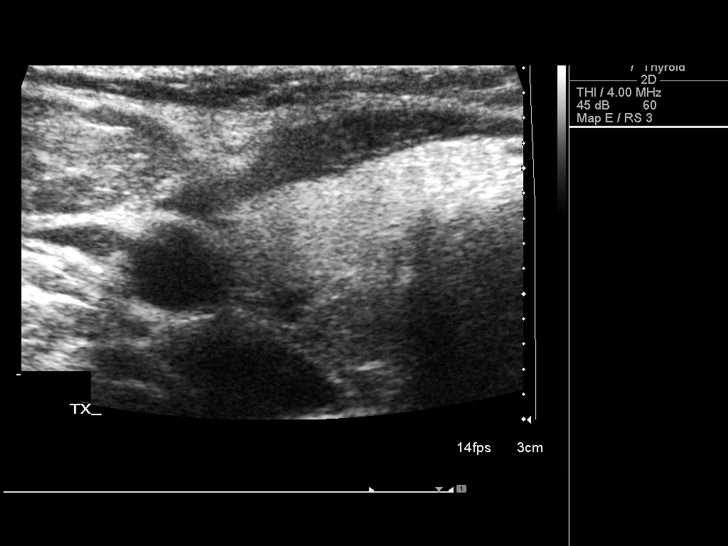

[13 of 18 positions shown; findings below may reference images not displayed]

Pre-procedural ultrasound scanning demonstrated unchanged size and
appearance of the indeterminate nodule within the right inferior
pole.

The procedure was planned. The neck was prepped in the usual sterile
fashion, and a sterile drape was applied covering the operative
field. A timeout was performed prior to the initiation of the
procedure. Local anesthesia was provided with 1% lidocaine.

Under direct ultrasound guidance, 4 FNA biopsies were performed of
the right inferior pole nodule with a 25 gauge needle. Multiple
ultrasound images were saved for procedural documentation purposes.
The samples were prepared and submitted to pathology.

Limited post procedural scanning was negative for hematoma or
additional complication. Dressings were placed. The patient
tolerated the above procedures procedure well without immediate
postprocedural complication.
FINDINGS: FINDINGS
Nodule reference number based on prior diagnostic ultrasound: 2

Maximum size: 1.7 cm

Location: Right  ;  Inferior

ACR TI-RADS total points: 5

ACR TI-RADS risk category:  TR4 (4-6 points)

Prior biopsy:  No

Reason for biopsy: meets ACR TI-RADS criteria

Ultrasound imaging confirms appropriate placement of the needles
within the thyroid nodule.
IMPRESSION: Technically successful ultrasound guided fine needle aspiration of
nodule 2 within the right inferior pole of the thyroid.

## 2017-05-07 ENCOUNTER — Other Ambulatory Visit: Payer: Self-pay | Admitting: Nurse Practitioner

## 2017-05-07 NOTE — Telephone Encounter (Addendum)
Pt last saw Truitt Merle, NP on 01/28/17, last labs in our chart from 05/02/16, called primary MD office Dr Addison Lank requested more recent lab work awaiting call back or labs to be faxed.  Age 52, weight 93.9kg, will refill rx once labs received and appropriate dosage verified.   Labs received from Dr Addison Lank from 01/07/17 Creat 1.01, age 22, weight 93.9kg, based on specified criteria pt is on appropriate dosage of Eliquis 5mg  BID.  Will refill rx and have labs scanned into chart.

## 2017-06-27 DIAGNOSIS — E049 Nontoxic goiter, unspecified: Secondary | ICD-10-CM | POA: Diagnosis not present

## 2017-06-27 DIAGNOSIS — Z79899 Other long term (current) drug therapy: Secondary | ICD-10-CM | POA: Diagnosis not present

## 2017-06-27 DIAGNOSIS — R5383 Other fatigue: Secondary | ICD-10-CM | POA: Diagnosis not present

## 2017-08-06 ENCOUNTER — Ambulatory Visit: Payer: BLUE CROSS/BLUE SHIELD | Admitting: Nurse Practitioner

## 2017-09-30 ENCOUNTER — Ambulatory Visit: Payer: BLUE CROSS/BLUE SHIELD | Admitting: Nurse Practitioner

## 2017-09-30 NOTE — Progress Notes (Deleted)
CARDIOLOGY OFFICE NOTE  Date:  09/30/2017    Elaine [name] Date of Birth: 02-12-1965 Medical Record #[id number]  PCP:  Cari Caraway, MD  Cardiologist:  Servando Snare & ***    No chief complaint on file.   History of Present Illness: Elaine [name] is a 52 y.o. female who presents today for a *** Former patient of Dr. Sherryl Barters. She primarily follows with me.  She has a history of PAF - seen in [facility] back in November 2014 with paroxysmal atrial fibrillation. She converted on IV diltiazem. Her echocardiogram at that time was normal. Chadsvasc score was 2 for hypertension and female sex. She was started on warfarin and then switched to NOAC. Other issues include past anemia, obesity,HLD and HTN. Negative OSA by sleep study noted.   Last seen back inApril. Had gotten married and had previously been really successful with losing weight (bought a dress that was 2 sizes too small).  She was doing well. Had started working from home and this had really impacted her ability to exercise and her weight had gone back up.   Comes in today. Here alone. Her weight is way up. Lots of stress. Her dad was sick and she was traveling - lots of fast food. No exercise. She has a sibling who is not really helping much. Her CCB has been recently increased by her PCP due to breakthru AF - this has helped. She has had severe constipation on the higher doses. She is now on cherry extract to help with her joints/tingling in feet.    Comes in today. Here with   Past Medical History:  Diagnosis Date  . Anemia    In past - borderline anemia requiring iron  . Atrial fibrillation ([place])    a. Formal dx 12/10/12 - suspected paroxysmal.  . HTN (hypertension)    a. Formally dx 11/2012 - mild.  . Hypercholesterolemia    Borderline - not requiring medicines  . Migraines   . Seasonal allergies   . Snoring 12/03/2014    Past Surgical History:  Procedure Laterality Date  . AUGMENTATION  MAMMAPLASTY Bilateral   . BREAST SURGERY    . egg donation     In her 61's.     Medications: No outpatient medications have been marked as taking for the 09/30/17 encounter (Appointment) with Burtis Junes, NP.     Allergies: Allergies  Allergen Reactions  . Other Other (See Comments)    Raw Almonds, itching   Peaches, itching  . Ampicillin Rash    Has taken amoxicillin without any reaction    Social History: The patient  reports that she has never smoked. She has never used smokeless tobacco. She reports that she drinks alcohol. She reports that she does not use drugs.   Family History: The patient's ***family history includes Aneurysm in her mother; Atrial fibrillation in her father and unknown relative; Breast cancer in her cousin and maternal grandmother; CAD in her paternal uncle; Migraines in her sister; Peripheral vascular disease in her mother; Stroke in her mother.   Review of Systems: Please see the history of present illness.   Otherwise, the review of systems is positive for {NONE DEFAULTED:18576::"none"}.   All other systems are reviewed and negative.   Physical Exam: VS:  LMP 10/01/2012  .  BMI There is no height or weight on file to calculate BMI.  Wt Readings from Last 3 Encounters:  01/28/17 207 lb (93.9 kg)  05/02/16  186 lb 12.8 oz (84.7 kg)  10/04/15 173 lb 12.8 oz (78.8 kg)    General: Pleasant. Well developed, well nourished and in no acute distress.   HEENT: Normal.  Neck: Supple, no JVD, carotid bruits, or masses noted.  Cardiac: ***Regular rate and rhythm. No murmurs, rubs, or gallops. No edema.  Respiratory:  Lungs are clear to auscultation bilaterally with normal work of breathing.  GI: Soft and nontender.  MS: No deformity or atrophy. Gait and ROM intact.  Skin: Warm and dry. Color is normal.  Neuro:  Strength and sensation are intact and no gross focal deficits noted.  Psych: Alert, appropriate and with normal affect.   LABORATORY  DATA:  EKG:  EKG {ACTION; IS/IS JJH:[id number]} ordered today. This demonstrates ***.  Lab Results  Component Value Date   WBC 7.7 05/02/2016   HGB 13.2 05/02/2016   HCT 40.4 05/02/2016   PLT 370 05/02/2016   GLUCOSE 91 05/02/2016   CHOL 215 (H) 03/29/2015   TRIG 106 03/29/2015   HDL 67 03/29/2015   LDLCALC 127 03/29/2015   ALT 14 03/29/2015   AST 16 03/29/2015   NA 138 05/02/2016   K 4.3 05/02/2016   CL 100 05/02/2016   CREATININE 0.94 05/02/2016   BUN 16 05/02/2016   CO2 27 05/02/2016   TSH 1.224 12/10/2012   INR 3.4 09/14/2013     BNP (last 3 results) No results for input(s): BNP in the last 8760 hours.  ProBNP (last 3 results) No results for input(s): PROBNP in the last 8760 hours.   Other Studies Reviewed Today:   Assessment/Plan:  Echo Study Conclusions from 2014  Left ventricle: The cavity size was normal. Wall thickness was normal. Systolic function was normal. The estimated ejection fraction was in the range of 60% to 65%. Wall motion was normal; there were no regional wall motion abnormalities.     Assessment/Plan:  1. PAF - with CHADSVASC of 2. CCB has recently been increased due to fleeting breakthru arrhythmia - this has helped. I suspect most of this has been triggered by stress and poor lifestyle.   2. HTN - BP ok on her current regimen. No changes made today  3. Obesity - encouraged her toget back on track.Long discussion today.   4. Chronic anticoagulation. No problems noted with Eliquis. She has had her surveillance labs last month - these are reviewed.  5. Situational stress - she is in the caregiver role - discussed at length today and gave some tips on how to get her sibling involved.   Current medicines are reviewed with the patient today.  The patient does not have concerns regarding medicines other than what has been noted above.  The following changes have been made:  See above.  Labs/ tests ordered today include:    No orders of the defined types were placed in this encounter.    Disposition:   FU with *** in {gen number 4-81:856314} {Days to years:10300}.   Patient is agreeable to this plan and will call if any problems develop in the interim.   SignedTruitt Merle, NP  09/30/2017 7:19 AM  [place] [address] [place], [place]  [address] Phone: [phone] Fax: [phone]

## 2017-10-04 DIAGNOSIS — N950 Postmenopausal bleeding: Secondary | ICD-10-CM | POA: Diagnosis not present

## 2017-10-08 ENCOUNTER — Other Ambulatory Visit: Payer: Self-pay | Admitting: Family Medicine

## 2017-10-08 DIAGNOSIS — N950 Postmenopausal bleeding: Secondary | ICD-10-CM

## 2017-10-11 ENCOUNTER — Ambulatory Visit
Admission: RE | Admit: 2017-10-11 | Discharge: 2017-10-11 | Disposition: A | Discharge status: Home / Self Care | Payer: BLUE CROSS/BLUE SHIELD | Source: Ambulatory Visit | Attending: Family Medicine | Admitting: Family Medicine

## 2017-10-11 DIAGNOSIS — N950 Postmenopausal bleeding: Secondary | ICD-10-CM

## 2017-10-16 ENCOUNTER — Other Ambulatory Visit: Payer: Self-pay | Admitting: Obstetrics and Gynecology

## 2017-10-16 DIAGNOSIS — N9489 Other specified conditions associated with female genital organs and menstrual cycle: Secondary | ICD-10-CM | POA: Diagnosis not present

## 2017-10-16 DIAGNOSIS — N858 Other specified noninflammatory disorders of uterus: Secondary | ICD-10-CM | POA: Diagnosis not present

## 2017-10-16 DIAGNOSIS — N950 Postmenopausal bleeding: Secondary | ICD-10-CM | POA: Diagnosis not present

## 2017-10-22 ENCOUNTER — Encounter: Payer: Self-pay | Admitting: Nurse Practitioner

## 2017-10-22 ENCOUNTER — Telehealth: Payer: Self-pay | Admitting: *Deleted

## 2017-10-22 ENCOUNTER — Ambulatory Visit: Payer: BLUE CROSS/BLUE SHIELD | Admitting: Nurse Practitioner

## 2017-10-22 VITALS — BP 140/80 | HR 94 | Ht 64.0 in | Wt 205.1 lb

## 2017-10-22 DIAGNOSIS — E7849 Other hyperlipidemia: Secondary | ICD-10-CM | POA: Diagnosis not present

## 2017-10-22 DIAGNOSIS — Z7901 Long term (current) use of anticoagulants: Secondary | ICD-10-CM | POA: Diagnosis not present

## 2017-10-22 DIAGNOSIS — I119 Hypertensive heart disease without heart failure: Secondary | ICD-10-CM

## 2017-10-22 DIAGNOSIS — I480 Paroxysmal atrial fibrillation: Secondary | ICD-10-CM

## 2017-10-22 MED ORDER — APIXABAN 5 MG PO TABS: 5.0000 mg | ORAL_TABLET | Freq: Two times a day (BID) | ORAL | 3 refills | Status: DC

## 2017-10-22 NOTE — Progress Notes (Signed)
CARDIOLOGY OFFICE NOTE  Date:  10/22/2017    Elaine [name] Date of Birth: 01/11/1966 Medical Record #[id number]  PCP:  Elaine Caraway, MD  Cardiologist:  Elaine [name]   Chief Complaint  Patient presents with  . Atrial Fibrillation    9 month check    History of Present Illness: Elaine [name] is a 52 y.o. female who presents today for a follow up visit. This is a 9 month check. Former patient of Dr. Sherryl [name]. She primarily follows with me.  She has a history of PAF -seen in [name] back in November 2014 with paroxysmal atrial fibrillation. She converted on IV diltiazem. Her echocardiogram at that time was normal. Chadsvasc score was 2 for hypertension and female sex. She was started on warfarin and then switched to NOAC. Other issues include past anemia, obesity,HLD and HTN. Negative OSA by sleep study noted.   She was doing quite well when I saw her in April of 2018 - had gotten married and had previously been really successful with losing weight (bought a dress that was 2 sizes too small).  She was doing well. Had started working from home and this had really impacted her ability to exercise and her weight had gone back up. Last visit in January - back off track and had gained weight - lots of stress with the caregiver role. CCB had been increased due to breakthru AF.   Comes in today. Here alone. She feels ok. She is being laid off from her job. Husband being laid off as well. Seems ok with it. Has had some fleeting palpitations - she has found that if she coughs - it improves. She has had issues with vaginal bleeding - she is for a D&C later this month - asking about Eliquis and how long to hold - did not hold for her biopsy. No chest pain. Breathing is good. Weight remains up. BP typically running lower at home.   Past Medical History:  Diagnosis Date  . Anemia    In past - borderline anemia requiring iron  . Atrial fibrillation ([place])    a. Formal dx 12/10/12 -  suspected paroxysmal.  . HTN (hypertension)    a. Formally dx 11/2012 - mild.  . Hypercholesterolemia    Borderline - not requiring medicines  . Migraines   . Seasonal allergies   . Snoring 12/03/2014    Past Surgical History:  Procedure Laterality Date  . AUGMENTATION MAMMAPLASTY Bilateral   . BREAST SURGERY    . egg donation     In her 53's.     Medications: Current Meds  Medication Sig  . apixaban (ELIQUIS) 5 MG TABS tablet Take 1 tablet (5 mg total) by mouth 2 (two) times daily.  . carboxymethylcellulose (REFRESH PLUS) 0.5 % SOLN Place 2 drops into both eyes 3 (three) times daily as needed.  . cholecalciferol (VITAMIN D) 1000 UNITS tablet Take 1,000 Units by mouth daily.  Marland Kitchen diltiazem (TIAZAC) 180 MG 24 hr capsule Take 180 mg by mouth daily.   Marland Kitchen docusate sodium (COLACE) 100 MG capsule Take 100 mg by mouth every other day.  . Misc Natural Products (TART CHERRY ADVANCED PO) Take 1 capsule by mouth daily.  . Multiple Vitamins-Minerals (ADULT GUMMY PO) Take 2 tablets by mouth daily.  . [DISCONTINUED] ELIQUIS 5 MG TABS tablet TAKE 1 TABLET BY MOUTH TWICE A DAY     Allergies: Allergies  Allergen Reactions  . Other Other (See Comments)  Raw Almonds, itching   Peaches, itching  . Ampicillin Rash    Has taken amoxicillin without any reaction    Social History: The patient  reports that she has never smoked. She has never used smokeless tobacco. She reports that she drinks alcohol. She reports that she does not use drugs.   Family History: The patient's family history includes Aneurysm in her mother; Atrial fibrillation in her father and unknown relative; Breast cancer in her cousin and maternal grandmother; CAD in her paternal uncle; Migraines in her sister; Peripheral vascular disease in her mother; Stroke in her mother.   Review of Systems: Please see the history of present illness.   Otherwise, the review of systems is positive for none.   All other systems are  reviewed and negative.   Physical Exam: VS:  BP 140/80 (BP Location: Left Arm, Patient Position: Sitting, Cuff Size: Large)   Pulse 94   Ht 5\' 4"  (1.626 m)   Wt 205 lb 1.9 oz (93 kg)   LMP 10/01/2012   SpO2 97%   BMI 35.21 kg/m  .  BMI Body mass index is 35.21 kg/m.  Wt Readings from Last 3 Encounters:  10/22/17 205 lb 1.9 oz (93 kg)  01/28/17 207 lb (93.9 kg)  05/02/16 186 lb 12.8 oz (84.7 kg)    General: Pleasant. Alert and in no acute distress.  She remains obese.  HEENT: Normal.  Neck: Supple, no JVD, carotid bruits, or masses noted.  Cardiac: Regular rate and rhythm. No murmurs, rubs, or gallops. No edema.  Respiratory:  Lungs are clear to auscultation bilaterally with normal work of breathing.  GI: Soft and nontender.  MS: No deformity or atrophy. Gait and ROM intact.  Skin: Warm and dry. Color is normal.  Neuro:  Strength and sensation are intact and no gross focal deficits noted.  Psych: Alert, appropriate and with normal affect.   LABORATORY DATA:  EKG:  EKG is not ordered today.  Lab Results  Component Value Date   WBC 7.7 05/02/2016   HGB 13.2 05/02/2016   HCT 40.4 05/02/2016   PLT 370 05/02/2016   GLUCOSE 91 05/02/2016   CHOL 215 (H) 03/29/2015   TRIG 106 03/29/2015   HDL 67 03/29/2015   LDLCALC 127 03/29/2015   ALT 14 03/29/2015   AST 16 03/29/2015   NA 138 05/02/2016   K 4.3 05/02/2016   CL 100 05/02/2016   CREATININE 0.94 05/02/2016   BUN 16 05/02/2016   CO2 27 05/02/2016   TSH 1.224 12/10/2012   INR 3.4 09/14/2013       BNP (last 3 results) No results for input(s): BNP in the last 8760 hours.  ProBNP (last 3 results) No results for input(s): PROBNP in the last 8760 hours.   Other Studies Reviewed Today:  Echo Study Conclusions from 2014  Left ventricle: The cavity size was normal. Wall thickness was normal. Systolic function was normal. The estimated ejection fraction was in the range of 60% to 65%. Wall motion was normal;  there were no regional wall motion abnormalities.     Assessment/Plan:  1. PAF - with CHADSVASC of 2. Fleeting palpitations. No changes made today. In sinus by exam.   2. HTN - better BP control at home. No changes made - she will continue to monitor.   3. Obesity - encouraged her toget back on track.  4. Chronic anticoagulation. No problems noted with Eliquis. She had lab back in June that are reviewed off the  [facility]. Discussed holding Eliquis with pharmacy - advised to hold for 24 hours prior to her planned procedure.   5. Situational stress - seems ok at this time - she is pretty upbeat.   Current medicines are reviewed with the patient today.  The patient does not have concerns regarding medicines other than what has been noted above.  The following changes have been made:  See above.  Labs/ tests ordered today include:   No orders of the defined types were placed in this encounter.    Disposition:   FU with me in one year.  Patient is agreeable to this plan and will call if any problems develop in the interim.   SignedTruitt Merle, NP  10/22/2017 9:15 AM  [place] [address] [place], [place]  [address] Phone: [phone] Fax: [phone]

## 2017-10-22 NOTE — Telephone Encounter (Signed)
   [facility] Pre-operative Risk Assessment    Request for surgical clearance:  1. What type of surgery is being performed?  HYSTERECTOMY/D&C/MYOSURE   2. When is this surgery scheduled? 11/13/17   3. What type of clearance is required (medical clearance vs. Pharmacy clearance to hold med vs. Both)?  BOTH  4. Are there any medications that need to be held prior to surgery and how long? ELIQUIS--HOW LONG SHOULD PATIENT HOLD ELIQUIS PRIOR TO PROCEDURE AND WHEN SHOULD SHE RESTART?   5. Practice name and name of physician performing surgery?  [facility] - PERFORMING MD NOT SPECIFIED    6. What is your office phone number [phone]    7.   What is your office fax number [phone]  8.   Anesthesia type (None, local, MAC, general) ?  NOT SPECIFIED   Nuala Alpha 10/22/2017, 5:02 PM  _________________________________________________________________   (provider comments below)

## 2017-10-22 NOTE — Patient Instructions (Addendum)
We will be checking the following labs today - NONE   Medication Instructions:    Continue with your current medicines.     Testing/Procedures To Be Arranged:  N/A  Follow-Up:   See me in one year    Other Special Instructions:   The pharmacist has advised that you hold your Eliquis for 24 hours prior to your upcoming D&C procedure.     If you need a refill on your cardiac medications before your next appointment, please call your pharmacy.   Call the Tower office at [phone] if you have any questions, problems or concerns.

## 2017-10-23 NOTE — Telephone Encounter (Signed)
Pt was seen yesterday by Truitt Merle, NP eliquis was addressed at [facility]. See ov note. Will fax via epic to [facility] ob-gyn.

## 2017-10-25 DIAGNOSIS — L02611 Cutaneous abscess of right foot: Secondary | ICD-10-CM | POA: Diagnosis not present

## 2017-10-25 DIAGNOSIS — Z23 Encounter for immunization: Secondary | ICD-10-CM | POA: Diagnosis not present

## 2017-10-29 ENCOUNTER — Encounter (HOSPITAL_COMMUNITY): Payer: Self-pay

## 2017-10-29 ENCOUNTER — Emergency Department (HOSPITAL_COMMUNITY)
Admission: EM | Admit: 2017-10-29 | Discharge: 2017-10-29 | Disposition: A | Discharge status: Home / Self Care | Payer: BLUE CROSS/BLUE SHIELD | Attending: Emergency Medicine | Admitting: Emergency Medicine

## 2017-10-29 ENCOUNTER — Telehealth: Payer: Self-pay | Admitting: *Deleted

## 2017-10-29 ENCOUNTER — Other Ambulatory Visit: Payer: Self-pay

## 2017-10-29 DIAGNOSIS — W540XXA Bitten by dog, initial encounter: Secondary | ICD-10-CM | POA: Diagnosis not present

## 2017-10-29 DIAGNOSIS — I10 Essential (primary) hypertension: Secondary | ICD-10-CM | POA: Insufficient documentation

## 2017-10-29 DIAGNOSIS — R2241 Localized swelling, mass and lump, right lower limb: Secondary | ICD-10-CM | POA: Diagnosis present

## 2017-10-29 DIAGNOSIS — Z7722 Contact with and (suspected) exposure to environmental tobacco smoke (acute) (chronic): Secondary | ICD-10-CM | POA: Insufficient documentation

## 2017-10-29 DIAGNOSIS — Z7901 Long term (current) use of anticoagulants: Secondary | ICD-10-CM | POA: Insufficient documentation

## 2017-10-29 DIAGNOSIS — Z79899 Other long term (current) drug therapy: Secondary | ICD-10-CM | POA: Insufficient documentation

## 2017-10-29 DIAGNOSIS — L03115 Cellulitis of right lower limb: Secondary | ICD-10-CM | POA: Diagnosis not present

## 2017-10-29 LAB — COMPREHENSIVE METABOLIC PANEL
ALT: 15 U/L (ref 0–44)
AST: 17 U/L (ref 15–41)
Albumin: 4 g/dL (ref 3.5–5.0)
Alkaline Phosphatase: 68 U/L (ref 38–126)
Anion gap: 10 (ref 5–15)
BUN: 14 mg/dL (ref 6–20)
CALCIUM: 9.6 mg/dL (ref 8.9–10.3)
CO2: 26 mmol/L (ref 22–32)
CREATININE: 0.99 mg/dL (ref 0.44–1.00)
Chloride: 103 mmol/L (ref 98–111)
GFR calc non Af Amer: >60 mL/min (ref >60)
GLUCOSE: 99 mg/dL (ref 70–99)
Potassium: 4.1 mmol/L (ref 3.5–5.1)
SODIUM: 139 mmol/L (ref 135–145)
TOTAL PROTEIN: 7.5 g/dL (ref 6.5–8.1)
Total Bilirubin: 0.5 mg/dL (ref 0.3–1.2)

## 2017-10-29 LAB — CBC WITH DIFFERENTIAL/PLATELET
Abs Immature Granulocytes: 0.02 10*3/uL (ref 0.00–0.07)
BASOS PCT: 0 %
Basophils Absolute: 0 10*3/uL (ref 0.0–0.1)
EOS ABS: 0.1 10*3/uL (ref 0.0–0.5)
EOS PCT: 1 %
HEMATOCRIT: 41.7 % (ref 36.0–46.0)
Hemoglobin: 13.2 g/dL (ref 12.0–15.0)
Immature Granulocytes: 0 %
LYMPHS ABS: 2.7 10*3/uL (ref 0.7–4.0)
Lymphocytes Relative: 29 %
MCH: 29.7 pg (ref 26.0–34.0)
MCHC: 31.7 g/dL (ref 30.0–36.0)
MCV: 93.7 fL (ref 80.0–100.0)
MONOS PCT: 7 %
Monocytes Absolute: 0.6 10*3/uL (ref 0.1–1.0)
NEUTROS PCT: 63 %
Neutro Abs: 5.8 10*3/uL (ref 1.7–7.7)
PLATELETS: 371 10*3/uL (ref 150–400)
RBC: 4.45 MIL/uL (ref 3.87–5.11)
RDW: 12.4 % (ref 11.5–15.5)
WBC: 9.3 10*3/uL (ref 4.0–10.5)
nRBC: 0 % (ref 0.0–0.2)

## 2017-10-29 LAB — I-STAT CG4 LACTIC ACID, ED: LACTIC ACID, VENOUS: 1.12 mmol/L (ref 0.5–1.9)

## 2017-10-29 MED ORDER — SULFAMETHOXAZOLE-TRIMETHOPRIM 800-160 MG PO TABS: 1.0000 | ORAL_TABLET | Freq: Two times a day (BID) | ORAL | 0 refills | Status: AC

## 2017-10-29 MED ORDER — SULFAMETHOXAZOLE-TRIMETHOPRIM 800-160 MG PO TABS
1.0000 | ORAL_TABLET | Freq: Once | ORAL | Status: AC
  Administered 2017-10-29: 1 via ORAL
  Filled 2017-10-29: qty 1

## 2017-10-29 NOTE — Consult Note (Signed)
Reason for Consult:  R foot pain / swelling Referring Physician:  Dr. Patria Mane is an 52 y.o. female.  HPI: The patient is a 52 year old female without significant past medical history.  She reports a 4-day history of increasing redness on the dorsum of her right foot.  This started last Thursday without any recent trauma.  She relates this area of swelling back to a dog bite that she sustained a month ago.  Her corgi better while trying to catch a bee.  She describes it as a small puncture wound.  She washed it and put Neosporin on it.  She reports that it healed up without difficulty.  She had no problem with pain or swelling until last Thursday when it became swollen and somewhat sore.  She denies fever, chills, nausea, vomiting or changes in her appetite.  She saw her primary care provider, Cari Caraway, MD, on Friday.  She was started on Augmentin.  She noted increased erythema yesterday and today and contacted her PCP.  She complains that her foot is more red this morning.  She denies any increase in pain.  She notes that the foot feels like it stretching if she bends her toes but denies any drainage from the wound.  She has never had any surgery to this foot in the past.  She denies any numbness, tingling or weakness in the foot.  Past Medical History:  Diagnosis Date  . Anemia    In past - borderline anemia requiring iron  . Atrial fibrillation ([place])    a. Formal dx 12/10/12 - suspected paroxysmal.  . HTN (hypertension)    a. Formally dx 11/2012 - mild.  . Hypercholesterolemia    Borderline - not requiring medicines  . Migraines   . Seasonal allergies   . Snoring 12/03/2014    Past Surgical History:  Procedure Laterality Date  . AUGMENTATION MAMMAPLASTY Bilateral   . BREAST SURGERY    . egg donation     In her 8's.    Family History  Problem Relation Age of Onset  . Atrial fibrillation Father   . Atrial fibrillation Unknown        Aunt  . Stroke Mother   .  Aneurysm Mother        Also had blood clot after sister was born  . Peripheral vascular disease Mother        In her 63's  . CAD Paternal Uncle        CABG age 84  . Migraines Sister   . Breast cancer Maternal Grandmother   . Breast cancer Cousin     Social History:  reports that she has never smoked. She has never used smokeless tobacco. She reports that she drinks alcohol. She reports that she does not use drugs.  Allergies:  Allergies  Allergen Reactions  . Other Other (See Comments)    Raw Almonds, itching   Peaches, itching  . Ampicillin Rash    Has taken amoxicillin without any reaction    Medications: I have reviewed the patient's current medications.  Results for orders placed or performed during the hospital encounter of 10/29/17 (from the past 48 hour(s))  Comprehensive metabolic panel     Status: None   Collection Time: 10/29/17  2:38 PM  Result Value Ref Range   Sodium 139 135 - 145 mmol/L   Potassium 4.1 3.5 - 5.1 mmol/L   Chloride 103 98 - 111 mmol/L   CO2 26 22 -  32 mmol/L   Glucose, Bld 99 70 - 99 mg/dL   BUN 14 6 - 20 mg/dL   Creatinine, Ser 0.99 0.44 - 1.00 mg/dL   Calcium 9.6 8.9 - 10.3 mg/dL   Total Protein 7.5 6.5 - 8.1 g/dL   Albumin 4.0 3.5 - 5.0 g/dL   AST 17 15 - 41 U/L   ALT 15 0 - 44 U/L   Alkaline Phosphatase 68 38 - 126 U/L   Total Bilirubin 0.5 0.3 - 1.2 mg/dL   GFR calc non Af Amer >60 >60 mL/min   GFR calc Af Amer >60 >60 mL/min    Comment: (NOTE) The eGFR has been calculated using the CKD EPI equation. This calculation has not been validated in all clinical situations. eGFR's persistently <60 mL/min signify possible Chronic Kidney Disease.    Anion gap 10 5 - 15    Comment: Performed at [facility] [address] [place], [place] [address]  CBC with Differential     Status: None   Collection Time: 10/29/17  2:38 PM  Result Value Ref Range   WBC 9.3 4.0 - 10.5 K/uL   RBC 4.45 3.87 - 5.11 MIL/uL   Hemoglobin 13.2 12.0 -  15.0 g/dL   HCT 41.7 36.0 - 46.0 %   MCV 93.7 80.0 - 100.0 fL   MCH 29.7 26.0 - 34.0 pg   MCHC 31.7 30.0 - 36.0 g/dL   RDW 12.4 11.5 - 15.5 %   Platelets 371 150 - 400 K/uL   nRBC 0.0 0.0 - 0.2 %   Neutrophils Relative % 63 %   Neutro Abs 5.8 1.7 - 7.7 K/uL   Lymphocytes Relative 29 %   Lymphs Abs 2.7 0.7 - 4.0 K/uL   Monocytes Relative 7 %   Monocytes Absolute 0.6 0.1 - 1.0 K/uL   Eosinophils Relative 1 %   Eosinophils Absolute 0.1 0.0 - 0.5 K/uL   Basophils Relative 0 %   Basophils Absolute 0.0 0.0 - 0.1 K/uL   Immature Granulocytes 0 %   Abs Immature Granulocytes 0.02 0.00 - 0.07 K/uL    Comment: Performed at [facility], [address], [place], [place] [address]  I-Stat CG4 Lactic Acid, ED     Status: None   Collection Time: 10/29/17  3:02 PM  Result Value Ref Range   Lactic Acid, Venous 1.12 0.5 - 1.9 mmol/L    No results found.  ROS: No recent fever, chills, nausea, vomiting or changes in her appetite PE:  Blood pressure (!) 156/91, pulse 74, temperature 98.6 F (37 C), temperature source Oral, resp. rate 18, height '5\' 4"'  (1.626 m), weight 92.5 kg, last menstrual period 10/01/2012, SpO2 100 %. Well-nourished well-developed woman in no apparent distress.  Alert and oriented x4.  Mood and affect are normal.  Extraocular motions are intact.  Respirations are unlabored.  Gait is normal.  The right foot has an area of erythema and induration over the dorsal lateral forefoot.  This area overlies the third and fourth metatarsal shafts at the metaphyseal junction.  There is no fluctuance or crepitus evident.  She is not particularly tender to palpation.  There is very slight warmth to this area.  Dorsalis pedis and posterior tibial pulses are 2+.  5 out of 5 strength in plantar flexion and dorsi flexion of the toes.  Intact sensibility to light touch dorsally and plantarly at the forefoot.  No lymphadenopathy is noted.  Ultrasound: Ultrasound of the forefoot  reveals no  evidence of abscess or fluid collection.  Assessment/Plan: Right dorsal forefoot cellulitis -based on the patient's exam and ultrasound I believe this area of swelling and erythema represents localized cellulitis.  At this point I would recommend adding Bactrim DS in addition to continuing her Augmentin.  I will plan to see her back in the office in a week to recheck this wound.  If she notices increasing swelling, pain or erythema she knows to contact me.  She understands this plan and agrees.  Wylene Simmer 10/29/2017, 4:33 PM

## 2017-10-29 NOTE — ED Triage Notes (Signed)
Pt has been on antibiotics for an animal bite to her right foot. Pt was bitten by her own dog, who is up to date all all shots. Pt has redness and swelling to top of right foot.

## 2017-10-29 NOTE — Telephone Encounter (Signed)
   [facility] Pre-operative Risk Assessment    Request for surgical clearance:  1. What type of surgery is being performed? Hysterectomy   2. When is this surgery scheduled? 11-13-17    3. What type of clearance is required (medical clearance vs. Pharmacy clearance to hold med vs. Both)? Both    4. Are there any medications that need to be held prior to surgery and how long? Eliquis   5. Practice name and name of physician performing surgery? [facility]   6. What is your office phone number [phone]    7.   What is your office fax number [phone] attn Evette Georges  8.   Anesthesia type (None, local, MAC, general) ? General   [name],[name] 10/29/2017, 11:11 AM  _________________________________________________________________   (provider comments below)

## 2017-10-29 NOTE — ED Provider Notes (Signed)
[facility] EMERGENCY DEPARTMENT Provider Note   CSN: [id number] Arrival date & time: 10/29/17  1325     History   Chief Complaint Chief Complaint  Patient presents with  . Animal Bite    HPI Elaine [name] is a 52 y.o. female.  Elaine [name] is a 52 y.o. female with a history of A. fib, hypertension, hyperlipidemia, migraines and seasonal allergies, who presents to the emergency department for evaluation of worsening redness and swelling after a dog bite.  She reports a 4-day history of increasing redness on the dorsum of her right foot.  This started last Thursday without any recent trauma.  She relates this area of swelling back to a dog bite that she sustained on September 2.  Her corgi bit her while trying to catch a bee, dog is up to date on all vaccinations.  She describes it as a small puncture wound.  She washed it and put Neosporin on it.  She reports that it healed up without difficulty.  She had no problem with pain or swelling until last Thursday when it became swollen and somewhat sore.  She denies fever, chills, nausea, vomiting or changes in her appetite.  She saw her primary care provider, Cari Caraway, MD, on Friday.  She was started on Augmentin and given a shot of rocephin.  She noted increased erythema yesterday and today and contacted her PCP.  She complains that her foot is more red this morning.  She denies any increase in pain.  She notes that the foot feels like it stretching if she bends her toes but denies any drainage from the wound.  Reports pain is worsened with weight bearing and walking. She has never had any surgery to this foot in the past.  She denies any numbness, tingling or weakness in the foot.     Past Medical History:  Diagnosis Date  . Anemia    In past - borderline anemia requiring iron  . Atrial fibrillation ([place])    a. Formal dx 12/10/12 - suspected paroxysmal.  . HTN (hypertension)    a. Formally dx 11/2012 - mild.  .  Hypercholesterolemia    Borderline - not requiring medicines  . Migraines   . Seasonal allergies   . Snoring 12/03/2014    Patient Active Problem List   Diagnosis Date Noted  . Snoring 12/03/2014  . Encounter for therapeutic drug monitoring 02/25/2013  . Benign hypertensive heart disease without heart failure 12/19/2012  . Atrial fibrillation ([place]) 12/12/2012  . Long term (current) use of anticoagulants 12/12/2012    Past Surgical History:  Procedure Laterality Date  . AUGMENTATION MAMMAPLASTY Bilateral   . BREAST SURGERY    . egg donation     In her 39's.     OB History   None      Home Medications    Prior to Admission medications   Medication Sig Start Date End Date Taking? Authorizing Provider  apixaban (ELIQUIS) 5 MG TABS tablet Take 1 tablet (5 mg total) by mouth 2 (two) times daily. 10/22/17   Burtis Junes, NP  carboxymethylcellulose (REFRESH PLUS) 0.5 % SOLN Place 2 drops into both eyes 3 (three) times daily as needed.    [provider]  cholecalciferol (VITAMIN D) 1000 UNITS tablet Take 1,000 Units by mouth daily.    [provider]  diltiazem (TIAZAC) 180 MG 24 hr capsule Take 180 mg by mouth daily.  01/24/17   [provider]  docusate sodium (COLACE) 100 MG capsule Take 100 mg by mouth every other day.    [provider]  Misc Natural Products (TART CHERRY ADVANCED PO) Take 1 capsule by mouth daily.    [provider]  Multiple Vitamins-Minerals (ADULT GUMMY PO) Take 2 tablets by mouth daily.    [provider]    Family History Family History  Problem Relation Age of Onset  . Atrial fibrillation Father   . Atrial fibrillation Unknown        Aunt  . Stroke Mother   . Aneurysm Mother        Also had blood clot after sister was born  . Peripheral vascular disease Mother        In her 66's  . CAD Paternal Uncle        CABG age 38  . Migraines Sister   . Breast cancer Maternal Grandmother   .  Breast cancer Cousin     Social History Social History   Tobacco Use  . Smoking status: Never Smoker  . Smokeless tobacco: Never Used  . Tobacco comment: Secondhand smoke up to age 70  Substance Use Topics  . Alcohol use: Yes    Comment: Occasional glass of wine on weekend or with dinner  . Drug use: No     Allergies   Other and Ampicillin   Review of Systems Review of Systems  Constitutional: Negative for chills and fever.  HENT: Negative.   Eyes: Negative for visual disturbance.  Respiratory: Negative for cough and shortness of breath.   Cardiovascular: Negative for chest pain.  Gastrointestinal: Negative for abdominal pain, nausea and vomiting.  Genitourinary: Negative for dysuria and hematuria.  Musculoskeletal: Negative for arthralgias and myalgias.  Skin: Positive for color change and wound. Negative for rash.  Neurological: Negative for dizziness, syncope and light-headedness.     Physical Exam Updated Vital Signs BP (!) 156/91 (BP Location: Right Arm)   Pulse 74   Temp 98.6 F (37 C) (Oral)   Resp 18   Ht 5\' 4"  (1.626 m)   Wt 92.5 kg   LMP 10/01/2012   SpO2 100%   BMI 35.02 kg/m   Physical Exam  Constitutional: She appears well-developed and well-nourished. No distress.  HENT:  Head: Normocephalic and atraumatic.  Eyes: Right eye exhibits no discharge. Left eye exhibits no discharge.  Neck: Neck supple.  Cardiovascular: Normal rate, regular rhythm, normal heart sounds and intact distal pulses.  Pulmonary/Chest: Effort normal and breath sounds normal. No respiratory distress.  Respirations equal and unlabored, patient able to speak in full sentences, lungs clear to auscultation bilaterally  Abdominal: Soft. Bowel sounds are normal. She exhibits no distension. There is no tenderness.  Musculoskeletal:  Localized area of erythema and induration over the dorsum of the left foot, there is no obvious fluctuance, no head and no appreciable drainage, there  is no erythema or wound over the plantar surface of the foot, 2+ radial pulse, sensation intact and normal strength. (See photo below)  Neurological: She is alert. Coordination normal.  Skin: Skin is warm and dry. She is not diaphoretic.  Psychiatric: She has a normal mood and affect. Her behavior is normal.  Nursing note and vitals reviewed.      ED Treatments / Results  Labs (all labs ordered are listed, but only abnormal results are displayed) Labs Reviewed  COMPREHENSIVE METABOLIC PANEL  CBC WITH DIFFERENTIAL/PLATELET  I-STAT CG4 LACTIC ACID, ED    EKG None  Radiology  No results found.  Procedures Procedures (including critical care time)  EMERGENCY DEPARTMENT US SOFT TISSUE INTERPRETATION "Study: Limited Soft Tissue Ultrasound"  INDICATIONS: Soft tissue infection Multiple views of the body part were obtained in real-time with a multi-frequency linear probe  PERFORMED BY: Myself IMAGES ARCHIVED?: Yes SIDE:Right  BODY PART:Lower extremity INTERPRETATION:  No abcess noted and Cellulitis present     Medications Ordered in ED Medications  sulfamethoxazole-trimethoprim (BACTRIM DS,SEPTRA DS) 800-160 MG per tablet 1 tablet (1 tablet Oral Given 10/29/17 1641)     Initial Impression / Assessment and Plan / ED Course  I have reviewed the triage vital signs and the nursing notes.  Pertinent labs & imaging results that were available during my care of the patient were reviewed by me and considered in my medical decision making (see chart for details).  Patient presents to the emergency depart from her primary care doctor's office for evaluation of worsening redness over the right foot associated with a dog bite about a month ago which had initially healed well but over the past 4 days she has had worsening redness and swelling.  Patient was given a shot of Rocephin and started on Augmentin, and has been monitoring the area closely but despite this reports worsening  redness, pain primarily with weightbearing.  On exam there is localized redness with induration but no obvious fluctuance, bedside ultrasound revealed signs consistent with cellulitis but no focal fluid collection.  Dr. Leonides Schanz, the patient's PCP, had contacted Dr. Doran Durand with [facility] who is also at the bedside to evaluate the patient and given exam and reassuring ultrasound would elect to add on Bactrim for MRSA coverage and monitor the area closely rather than proceeding with incision and drainage.  Labs were ordered from triage and are overall very reassuring, no leukocytosis, normal hemoglobin, no acute electrolyte derangements and normal renal and liver function, lactic acid is within normal limits.  Wound was marked here in the emergency department and patient was given first dose of Bactrim she is Artie taken her Augmentin today.  Dr. Doran Durand will see the patient for close follow-up in his office I have discussed return precautions with the patient and she expresses understanding and is in agreement with plan.  No further questions at this time.  Stable for discharge home.  Final Clinical Impressions(s) / ED Diagnoses   Final diagnoses:  Dog bite, initial encounter  Cellulitis of right lower extremity    ED Discharge Orders         Ordered    sulfamethoxazole-trimethoprim (BACTRIM DS,SEPTRA DS) 800-160 MG tablet  2 times daily     10/29/17 1654           Jacqlyn Larsen, [place] 10/29/17 1715    Dorie Rank, MD 10/30/17 2336

## 2017-10-29 NOTE — ED Provider Notes (Signed)
Patient placed in Quick Look pathway, seen and evaluated   Chief Complaint: dog bite  HPI:   Dog bite last week.  Pt noticed redness on Thursday, went to PCP who gave her shot of rocephin on Friday and prescribed augmentin. Has taken augmentin since Saturday.  Noticed worsening redness and pain Sunday.   ROS: negative: fevers, chills, malaise, h/o immunosuppression.   Physical Exam:   Gen: No distress  Neuro: Awake and Alert  Skin: Warm   Focused Exam: small area of erythema, warmth, tenderness to right dorsal foot without obvious fluctuance or abscess. Moderate edema diffusely to foot. See picture.        Initiation of care has begun. The patient has been counseled on the process, plan, and necessity for staying for the completion/evaluation, and the remainder of the medical screening examination    Arlean Hopping 10/29/17 1457    Jola Schmidt, MD 10/29/17 1640

## 2017-10-29 NOTE — Discharge Instructions (Signed)
In addition to Augmentin please take Bactrim twice daily.  Monitor the area closely for worsening redness, swelling, pain or any drainage from the area, as well as fevers or chills, nausea or vomiting.  If any of these symptoms occur contact Dr. Doran Durand or return to the emergency department for reevaluation, otherwise follow-up with Dr. Doran Durand in the office next week as planned, his office will contact you for scheduling.

## 2017-10-31 DIAGNOSIS — Z01818 Encounter for other preprocedural examination: Secondary | ICD-10-CM | POA: Diagnosis not present

## 2017-10-31 DIAGNOSIS — L03115 Cellulitis of right lower limb: Secondary | ICD-10-CM | POA: Diagnosis not present

## 2017-10-31 DIAGNOSIS — N950 Postmenopausal bleeding: Secondary | ICD-10-CM | POA: Diagnosis not present

## 2017-10-31 NOTE — Telephone Encounter (Signed)
   Primary Cardiologist: Truitt Merle, NP  Chart reviewed as part of pre-operative protocol coverage. Given past medical history and time since last visit, based on ACC/AHA guidelines, Elaine [name] would be at acceptable risk for the planned procedure without further cardiovascular testing.  Hold the eliquis 24 hours prior to procedure.    I will route this recommendation to the requesting party via Epic fax function and remove from pre-op pool.  Please call with questions.  Cecilie Kicks, NP 10/31/2017, 4:07 PM

## 2017-11-01 ENCOUNTER — Encounter (HOSPITAL_COMMUNITY): Payer: Self-pay

## 2017-11-01 NOTE — Patient Instructions (Addendum)
Your procedure is scheduled on: Wednesday November 13, 2017 at 12:45 pm  Enter through the Main Entrance of [facility] at: 11:15 am  Pick up the phone at the desk and dial [phone].  Call this number if you have problems the morning of surgery: [phone].  Remember: Do NOT eat food or Do Not Drink (including water) after midnight on Tuesday October 22  Take these medicines the morning of surgery with a SIP OF WATER: None  Follow the instructions by the doctor who prescribes Eliquis as to when to stop Eliquis prior to surgery and when to restart Eliquis after surgery.   STOP ALL VITAMINS, SUPPLEMENTS, HERBAL MEDICATIONS, NSAIDS/Ibuprofen NOW.  You may use tylenol if needed.  BRUSH YOUR TEETH DAY OF SURGERY   Do NOT wear jewelry (body piercing), metal hair clips/bobby pins, make-up, or nail polish. Do NOT wear lotions, powders, or perfumes.  You may wear deoderant. Do NOT shave for 48 hours prior to surgery. Do NOT bring valuables to the hospital.  Have a responsible adult drive you home and stay with you for 24 hours after your procedure.  Home with Husband Darnelle Maffucci cell [phone]

## 2017-11-05 ENCOUNTER — Telehealth: Payer: Self-pay | Admitting: Nurse Practitioner

## 2017-11-05 NOTE — Telephone Encounter (Signed)
   Medical clearance for upcoming hysterectomy 11/13/17 was re-faxed to [facility] on 11/05/17.   Callback: - Please follow-up with Evette Georges at [facility] to ensure clearance has been received.   Abigail Butts, PA-C 11/05/17

## 2017-11-05 NOTE — Telephone Encounter (Signed)
I called the OB-GYN office to confirm if clearance letter received. Meredith at [employer] clearance not rcv'd and that they have been having problems with the fax # [phone]. Ailene Ravel asked if I could please re-fax to [phone].

## 2017-11-05 NOTE — Telephone Encounter (Signed)
New Message:       Ailene Ravel from [facility] is calling to see if we can re-fax the patients clearance due to their fax machine being done. The fax number is [phone] and please put attention [facility].

## 2017-11-05 NOTE — Telephone Encounter (Signed)
I will remove from the call back pool.

## 2017-11-06 ENCOUNTER — Encounter (HOSPITAL_COMMUNITY): Payer: Self-pay

## 2017-11-06 ENCOUNTER — Other Ambulatory Visit: Payer: Self-pay

## 2017-11-06 ENCOUNTER — Encounter (HOSPITAL_COMMUNITY)
Admission: RE | Admit: 2017-11-06 | Discharge: 2017-11-06 | Disposition: A | Discharge status: Home / Self Care | Payer: BLUE CROSS/BLUE SHIELD | Source: Ambulatory Visit | Attending: Obstetrics and Gynecology | Admitting: Obstetrics and Gynecology

## 2017-11-06 DIAGNOSIS — Z01812 Encounter for preprocedural laboratory examination: Secondary | ICD-10-CM | POA: Diagnosis not present

## 2017-11-06 HISTORY — DX: Gastro-esophageal reflux disease without esophagitis: K21.9

## 2017-11-06 HISTORY — DX: Unspecified osteoarthritis, unspecified site: M19.90

## 2017-11-06 HISTORY — DX: Dyspnea, unspecified: R06.00

## 2017-11-06 HISTORY — DX: Nontoxic goiter, unspecified: E04.9

## 2017-11-06 LAB — PROTIME-INR
INR: 1.09
PROTHROMBIN TIME: 14 s (ref 11.4–15.2)

## 2017-11-06 LAB — BASIC METABOLIC PANEL
Anion gap: 11 (ref 5–15)
BUN: 18 mg/dL (ref 6–20)
CALCIUM: 9.7 mg/dL (ref 8.9–10.3)
CO2: 25 mmol/L (ref 22–32)
CREATININE: 1.33 mg/dL — AB (ref 0.44–1.00)
Chloride: 101 mmol/L (ref 98–111)
GFR, EST AFRICAN AMERICAN: 52 mL/min — AB (ref >60)
GFR, EST NON AFRICAN AMERICAN: 45 mL/min — AB (ref >60)
Glucose, Bld: 96 mg/dL (ref 70–99)
Potassium: 4.4 mmol/L (ref 3.5–5.1)
SODIUM: 137 mmol/L (ref 135–145)

## 2017-11-06 LAB — CBC
HCT: 39.7 % (ref 36.0–46.0)
Hemoglobin: 13.3 g/dL (ref 12.0–15.0)
MCH: 31.1 pg (ref 26.0–34.0)
MCHC: 33.5 g/dL (ref 30.0–36.0)
MCV: 92.8 fL (ref 80.0–100.0)
PLATELETS: 382 10*3/uL (ref 150–400)
RBC: 4.28 MIL/uL (ref 3.87–5.11)
RDW: 13.1 % (ref 11.5–15.5)
WBC: 7.9 10*3/uL (ref 4.0–10.5)
nRBC: 0 % (ref 0.0–0.2)

## 2017-11-06 LAB — APTT: APTT: 29 s (ref 24–36)

## 2017-11-06 NOTE — Pre-Procedure Instructions (Signed)
Cardiac clearance in Epic dated 10/29/17 by Cecilie Kicks, NP.  Patient was instructed to stop Elquis 24 hours prior to surgery.  Dr; Ambrose Pancoast informed, stated patient would have general anesthesia.  No orders given.  [place] for surgery.

## 2017-11-08 NOTE — Telephone Encounter (Signed)
  Patient needs to know when she can restart her Eliquis

## 2017-11-11 NOTE — Telephone Encounter (Signed)
Left pt a message to call back and ask for the preop call back pool in re: when she can restart her Eliquis after her surgery. See Melina Copa, PA-C's response below.

## 2017-11-11 NOTE — Telephone Encounter (Signed)
Pt called back re: when to restart her Eliquis. Pt was advised that her surgeon needs to instruct her on when that should be. She verbalized understanding.

## 2017-11-11 NOTE — Telephone Encounter (Signed)
   Primary Cardiologist: Truitt Merle, NP  Chart reviewed as part of pre-operative protocol coverage. Please let patient know it is typically up to the surgeon/provider performing procedure to let the patient know when they can safetly restart. She should discuss with OB/GYN.  Charlie Pitter, PA-C 11/11/2017, 3:18 PM

## 2017-11-12 ENCOUNTER — Encounter (HOSPITAL_COMMUNITY): Payer: Self-pay

## 2017-11-12 ENCOUNTER — Telehealth: Payer: Self-pay | Admitting: Physician Assistant

## 2017-11-12 NOTE — Telephone Encounter (Signed)
Received call from Dr. Simona Huh to clarify when to resume Eliquis. She clarified procedure was hysteroscopy, not hysterectomy. Holding Eliquis was already addressed in prior notes by Cecille Rubin. D/w pharmacist, would resume AM of 10/24. Dr. Simona Huh verbalized understanding. Miyeko Mahlum PA-C

## 2017-11-12 NOTE — H&P (Signed)
History of Present Illness  General:  Pt presents for preop for hyst/D&C, polylpectomy due to h/o PMB. Pt saw Earl Lagos. Recommended pt stay off Eliquis 24 hours prior to surgery. Pt reports she is being treated for a cellulitis on her right foot. Currently taking antibiotics. Denies abnormal discharge but concerned about development of yeast infection prior to surgery.   Current Medications  Taking   Acetaminophen 500 MG Capsule 2 capsules as needed Orally every 6 hrs   Amoxicillin-Pot Clavulanate 875-125 MG Tablet 1 tablet Orally every 12 hrs   Bactrim DS(Sulfamethoxazole-TMP DS) 800-160 MG Tablet 1 tablet Orally Twice a day   Diltiazem HCl ER Beads 180 MG Capsule Extended Release 24 Hour 1 capsule Orally Once a day   Docusate Sodium 100 MG Capsule 1 capsule as needed Orally Once a day   Eliquis(Apixaban) 5 MG Tablet 1 tablet Orally twice a day, Notes: Gerhardt   Melatonin 5 MG Tablet 2-3 tabs 1-2 hours before bedtime Orally Once a day as needed   MiraLax(Polyethylene Glycol 3350) - Packet 1 packet mixed with 8 ounces of fluid Orally Once a day as needed   Multivitamin Gummies Adult - Tablet Chewable 2 tablets Orally once a day   Probiotic 250 MG Capsule 1 capsule Orally Twice a day   Vitamin D3 1000 UNIT Capsule 1 capsule Orally Once a day   Medication List reviewed and reconciled with the patient    Past Medical History  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   Migraines .   Hypercholesterolemia.   Atrial Fibrillation - Dr. Darlin Coco / Truitt Merle, NP.   Hypertension.   Seasonal allergies.           Surgical History  bilateral breast augmentation 1997  bilateral carpal tunnel with R tendon repair 2010  wisdom teeth extraction age 36  colonoscopy 02/2015   Family History  Father: alive, AFIB, diagnosed with Atrial fibrillation  Mother: deceased, brain aneurysm at age 61, Left sided stroke, CVA  Paternal Hodges  Father: deceased, h/o colon cancer, pacemaker, Colon cancer  Maternal Grand Mother: h/o breast CA, Breast cancer  Sister 1: alive, asthma  Paternal aunt: Atrial fibrillation  1 sister(s) . 2 son(s) .   Neg fam hx for liver Dz and colon polyps  1 son died of sudden infant death syndrome.   Social History  General:  no EXPOSURE TO PASSIVE SMOKE.  Alcohol: yes, 1-2 per week.  Caffeine: yes, coffee, 1/2 decaf.  no DIET, eating more fiber, water but fights with stress eating, trying to eat fruit, more salads.  Tobacco use  cigarettes: Never smoked Tobacco history last updated 10/31/2017 Vaping No Marital Status: married.  no Recreational drug use.  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  Exercise: yes, currently in a walking program - 30-45 min., 4 days a week (started it a week ago).    Gyn History  Sexual activity currently sexually active.  LMP 05/2014, Started bleeding 2 weeks ago, lasting for 5 day, 10/16/2017.  Birth control vasectomy/postmenopausal.  Last pap smear date 11/28/2015 Neg/HPVneg.  Last mammogram date 12/05/2016.  Abnormal pap smear ASCUS (+) HR HPV, 2004, assessed with colposcopy followed by serial paps Normal pap, Neg HR HPV 2013 (gyn in [place]).  STD none.    OB History  OB History G2P2001.    Allergies  Ampicillin: rash - Allergy   Hospitalization/Major Diagnostic Procedure  sleep study 12/2014  None this past yr.  09/2017   Review of Systems  Denies fever/chills, chest pain, SOB, headaches, numbness/tingling. No h/o complication with anesthesia, bleeding disorders or blood clots.   Vital Signs  Wt 207.4, Wt change 2 lb, Ht 64.5, BMI 35.05, Pulse sitting 74, BP sitting 122/80.   Physical Examination  Chaperone present:  Chaperone present Tyus,Brendell 10/31/2017 12:11:28 PM > , for pelvic exam.  GENERAL:  Patient appears alert and oriented.  General Appearance: well-appearing, well-developed, no acute  distress.  Speech: clear.  LUNGS:  Auscultation: no wheezing/rhonchi/rales. CTA bilaterally.  HEART:  Heart sounds: normal. RRR. no murmur.  ABDOMEN:  General: soft nontender, nondistended, no masses.  FEMALE GENITOURINARY:  Pelvic Vulva-no lesionsVagina-normalCervix-no lesions.  EXTREMITIES:  General: No edema or calf tenderness.     Assessments   1. Pre-operative clearance - Z01.818 (Primary)   2. Cellulitis of foot - L03.119   Treatment  1. Pre-operative clearance  Notes: Counseled on R/B/A of procedure. All questions answered.    2. Cellulitis of foot  Start Diflucan Tablet, 150 MG, 1 tablet, Orally, Once, 1 days, 1, Refills 2   Visit Codes  99213 OV LEVEL 3.    Follow Up  2 Weeks post op

## 2017-11-13 ENCOUNTER — Ambulatory Visit (HOSPITAL_COMMUNITY)
Admission: RE | Admit: 2017-11-13 | Discharge: 2017-11-13 | Disposition: A | Discharge status: Home / Self Care | Payer: BLUE CROSS/BLUE SHIELD | Source: Ambulatory Visit | Attending: Obstetrics and Gynecology | Admitting: Obstetrics and Gynecology

## 2017-11-13 ENCOUNTER — Ambulatory Visit (HOSPITAL_COMMUNITY): Payer: BLUE CROSS/BLUE SHIELD | Admitting: Anesthesiology

## 2017-11-13 ENCOUNTER — Other Ambulatory Visit: Payer: Self-pay

## 2017-11-13 ENCOUNTER — Encounter (HOSPITAL_COMMUNITY): Payer: Self-pay | Admitting: *Deleted

## 2017-11-13 ENCOUNTER — Encounter (HOSPITAL_COMMUNITY)
Admission: RE | Disposition: A | Discharge status: Home / Self Care | Payer: Self-pay | Source: Ambulatory Visit | Attending: Obstetrics and Gynecology

## 2017-11-13 DIAGNOSIS — Z7901 Long term (current) use of anticoagulants: Secondary | ICD-10-CM | POA: Diagnosis not present

## 2017-11-13 DIAGNOSIS — N840 Polyp of corpus uteri: Secondary | ICD-10-CM | POA: Insufficient documentation

## 2017-11-13 DIAGNOSIS — Z79899 Other long term (current) drug therapy: Secondary | ICD-10-CM | POA: Insufficient documentation

## 2017-11-13 DIAGNOSIS — Z880 Allergy status to penicillin: Secondary | ICD-10-CM | POA: Insufficient documentation

## 2017-11-13 DIAGNOSIS — I4891 Unspecified atrial fibrillation: Secondary | ICD-10-CM | POA: Diagnosis not present

## 2017-11-13 DIAGNOSIS — N924 Excessive bleeding in the premenopausal period: Secondary | ICD-10-CM | POA: Diagnosis not present

## 2017-11-13 DIAGNOSIS — D259 Leiomyoma of uterus, unspecified: Secondary | ICD-10-CM | POA: Diagnosis not present

## 2017-11-13 DIAGNOSIS — I10 Essential (primary) hypertension: Secondary | ICD-10-CM | POA: Insufficient documentation

## 2017-11-13 DIAGNOSIS — N950 Postmenopausal bleeding: Secondary | ICD-10-CM | POA: Insufficient documentation

## 2017-11-13 DIAGNOSIS — N858 Other specified noninflammatory disorders of uterus: Secondary | ICD-10-CM | POA: Diagnosis not present

## 2017-11-13 DIAGNOSIS — E7800 Pure hypercholesterolemia, unspecified: Secondary | ICD-10-CM | POA: Diagnosis not present

## 2017-11-13 HISTORY — PX: DILATATION & CURETTAGE/HYSTEROSCOPY WITH MYOSURE: SHX6511

## 2017-11-13 SURGERY — DILATATION & CURETTAGE/HYSTEROSCOPY WITH MYOSURE
Anesthesia: General

## 2017-11-13 MED ORDER — LACTATED RINGERS IV SOLN
INTRAVENOUS | Status: DC
  Administered 2017-11-13 (×2): via INTRAVENOUS

## 2017-11-13 MED ORDER — OXYCODONE HCL 5 MG PO TABS: 5.0000 mg | ORAL_TABLET | Freq: Once | ORAL | Status: DC | PRN

## 2017-11-13 MED ORDER — ACETAMINOPHEN 160 MG/5ML PO SOLN: 325.0000 mg | ORAL | Status: DC | PRN

## 2017-11-13 MED ORDER — VASOPRESSIN 20 UNIT/ML IV SOLN
INTRAVENOUS | Status: AC
  Filled 2017-11-13: qty 1

## 2017-11-13 MED ORDER — LIDOCAINE HCL 2 % IJ SOLN
INTRAMUSCULAR | Status: DC | PRN
  Administered 2017-11-13: 10 mL

## 2017-11-13 MED ORDER — ONDANSETRON HCL 4 MG/2ML IJ SOLN: 4.0000 mg | Freq: Once | INTRAMUSCULAR | Status: DC | PRN

## 2017-11-13 MED ORDER — GLYCOPYRROLATE 0.2 MG/ML IJ SOLN
INTRAMUSCULAR | Status: AC
  Filled 2017-11-13: qty 1

## 2017-11-13 MED ORDER — DEXAMETHASONE SODIUM PHOSPHATE 4 MG/ML IJ SOLN
INTRAMUSCULAR | Status: DC | PRN
  Administered 2017-11-13: 10 mg via INTRAVENOUS

## 2017-11-13 MED ORDER — ACETAMINOPHEN 500 MG PO TABS: 1000.0000 mg | ORAL_TABLET | Freq: Three times a day (TID) | ORAL | 0 refills | Status: DC | PRN

## 2017-11-13 MED ORDER — LIDOCAINE HCL (CARDIAC) PF 100 MG/5ML IV SOSY
PREFILLED_SYRINGE | INTRAVENOUS | Status: AC
  Filled 2017-11-13: qty 5

## 2017-11-13 MED ORDER — FENTANYL CITRATE (PF) 250 MCG/5ML IJ SOLN
INTRAMUSCULAR | Status: DC | PRN
  Administered 2017-11-13 (×2): 50 ug via INTRAVENOUS

## 2017-11-13 MED ORDER — SCOPOLAMINE 1 MG/3DAYS TD PT72
MEDICATED_PATCH | TRANSDERMAL | Status: AC
  Administered 2017-11-13: 1.5 mg via TRANSDERMAL
  Filled 2017-11-13: qty 1

## 2017-11-13 MED ORDER — OXYCODONE HCL 5 MG/5ML PO SOLN: 5.0000 mg | Freq: Once | ORAL | Status: DC | PRN

## 2017-11-13 MED ORDER — SODIUM CHLORIDE 0.9 % IR SOLN
Status: DC | PRN
  Administered 2017-11-13: 6000 mL

## 2017-11-13 MED ORDER — DEXAMETHASONE SODIUM PHOSPHATE 10 MG/ML IJ SOLN
INTRAMUSCULAR | Status: AC
  Filled 2017-11-13: qty 1

## 2017-11-13 MED ORDER — MIDAZOLAM HCL 2 MG/2ML IJ SOLN
INTRAMUSCULAR | Status: DC | PRN
  Administered 2017-11-13: 2 mg via INTRAVENOUS

## 2017-11-13 MED ORDER — PROPOFOL 10 MG/ML IV BOLUS
INTRAVENOUS | Status: DC | PRN
  Administered 2017-11-13: 200 mg via INTRAVENOUS

## 2017-11-13 MED ORDER — VASOPRESSIN 20 UNIT/ML IV SOLN
INTRAVENOUS | Status: DC | PRN
  Administered 2017-11-13: 10 mL via INTRAMUSCULAR

## 2017-11-13 MED ORDER — MIDAZOLAM HCL 2 MG/2ML IJ SOLN
INTRAMUSCULAR | Status: AC
  Filled 2017-11-13: qty 2

## 2017-11-13 MED ORDER — ACETAMINOPHEN 325 MG PO TABS: 325.0000 mg | ORAL_TABLET | ORAL | Status: DC | PRN

## 2017-11-13 MED ORDER — FENTANYL CITRATE (PF) 100 MCG/2ML IJ SOLN
INTRAMUSCULAR | Status: AC
  Filled 2017-11-13: qty 2

## 2017-11-13 MED ORDER — SODIUM CHLORIDE 0.9 % IJ SOLN
INTRAMUSCULAR | Status: AC
  Filled 2017-11-13: qty 50

## 2017-11-13 MED ORDER — KETOROLAC TROMETHAMINE 30 MG/ML IJ SOLN: 30.0000 mg | Freq: Once | INTRAMUSCULAR | Status: DC | PRN

## 2017-11-13 MED ORDER — PROPOFOL 10 MG/ML IV BOLUS
INTRAVENOUS | Status: AC
  Filled 2017-11-13: qty 40

## 2017-11-13 MED ORDER — MEPERIDINE HCL 25 MG/ML IJ SOLN: 6.2500 mg | INTRAMUSCULAR | Status: DC | PRN

## 2017-11-13 MED ORDER — FENTANYL CITRATE (PF) 100 MCG/2ML IJ SOLN
INTRAMUSCULAR | Status: AC
  Filled 2017-11-13: qty 4

## 2017-11-13 MED ORDER — LIDOCAINE HCL (CARDIAC) PF 100 MG/5ML IV SOSY
PREFILLED_SYRINGE | INTRAVENOUS | Status: DC | PRN
  Administered 2017-11-13: 50 mg via INTRAVENOUS

## 2017-11-13 MED ORDER — KETOROLAC TROMETHAMINE 30 MG/ML IJ SOLN
INTRAMUSCULAR | Status: DC | PRN
  Administered 2017-11-13: 30 mg via INTRAVENOUS

## 2017-11-13 MED ORDER — ONDANSETRON HCL 4 MG/2ML IJ SOLN
INTRAMUSCULAR | Status: AC
  Filled 2017-11-13: qty 2

## 2017-11-13 MED ORDER — LIDOCAINE HCL 2 % IJ SOLN
INTRAMUSCULAR | Status: AC
  Filled 2017-11-13: qty 20

## 2017-11-13 MED ORDER — ONDANSETRON HCL 4 MG/2ML IJ SOLN
INTRAMUSCULAR | Status: DC | PRN
  Administered 2017-11-13: 4 mg via INTRAVENOUS

## 2017-11-13 MED ORDER — FENTANYL CITRATE (PF) 100 MCG/2ML IJ SOLN: 25.0000 ug | INTRAMUSCULAR | Status: DC | PRN

## 2017-11-13 MED ORDER — SCOPOLAMINE 1 MG/3DAYS TD PT72
1.0000 | MEDICATED_PATCH | Freq: Once | TRANSDERMAL | Status: DC
  Administered 2017-11-13: 1.5 mg via TRANSDERMAL

## 2017-11-13 SURGICAL SUPPLY — 16 items
CANISTER SUCT 3000ML PPV (MISCELLANEOUS) ×2 IMPLANT
CATH ROBINSON RED A/P 16FR (CATHETERS) ×2 IMPLANT
DEVICE MYOSURE LITE (MISCELLANEOUS) ×2 IMPLANT
DEVICE MYOSURE REACH (MISCELLANEOUS) IMPLANT
DILATOR CANAL MILEX (MISCELLANEOUS) ×2 IMPLANT
FILTER ARTHROSCOPY CONVERTOR (FILTER) ×2 IMPLANT
GLOVE BIO SURGEON STRL SZ7 (GLOVE) ×2 IMPLANT
GLOVE BIOGEL PI IND STRL 7.0 (GLOVE) ×2 IMPLANT
GLOVE BIOGEL PI INDICATOR 7.0 (GLOVE) ×2
GOWN STRL REUS W/TWL LRG LVL3 (GOWN DISPOSABLE) ×4 IMPLANT
PACK VAGINAL MINOR WOMEN LF (CUSTOM PROCEDURE TRAY) ×2 IMPLANT
PAD OB MATERNITY 4.3X12.25 (PERSONAL CARE ITEMS) ×2 IMPLANT
SEAL ROD LENS SCOPE MYOSURE (ABLATOR) ×2 IMPLANT
TOWEL OR 17X24 6PK STRL BLUE (TOWEL DISPOSABLE) ×4 IMPLANT
TUBING AQUILEX INFLOW (TUBING) ×2 IMPLANT
TUBING AQUILEX OUTFLOW (TUBING) ×2 IMPLANT

## 2017-11-13 NOTE — Anesthesia Preprocedure Evaluation (Signed)
Anesthesia Evaluation  Patient identified by MRN, date of birth, ID band Patient awake    Reviewed: Allergy & Precautions, NPO status , Patient's Chart, lab work & pertinent test results  Airway Mallampati: I       Dental no notable dental hx. (+) Teeth Intact   Pulmonary    Pulmonary exam normal breath sounds clear to auscultation       Cardiovascular hypertension, Pt. on medications Normal cardiovascular exam Rhythm:Regular Rate:Normal     Neuro/Psych negative neurological ROS  negative psych ROS   GI/Hepatic   Endo/Other    Renal/GU      Musculoskeletal   Abdominal (+) + obese,   Peds  Hematology   Anesthesia Other Findings ARALI SOMERA  2D Echocardiogram with contrast  Order# [id number]  Ordering physician: Charlie Pitter, PA-C Study date: 12/10/2012 Study Result   Result status: Final result *[facility]*           [address], [place] [address]            [phone]  ------------------------------------------------------------ Transthoracic Echocardiography  Patient:  Elaine [name], Elaine [name] MR #:    [id number] Study Date: 12/10/2012 Gender:   F Age:    52 Height:   162.6cm Weight:   87.7kg BSA:    2.80m^2 Pt. Status: Room:    Sachse, RDCS ORDERING   Dunn, Dayna REFERRING  Dunn, [facility] ATTENDING  Ward, Cyril Mourning cc:  ------------------------------------------------------------ LV EF: 60% -  65%  ------------------------------------------------------------ Indications:   Atrial fibrillation - 427.31.  ------------------------------------------------------------ History:  PMH: No prior cardiac history.  ------------------------------------------------------------ Study Conclusions  Left ventricle: The  cavity size was normal. Wall thickness was normal. Systolic function was normal. The estimated ejection fraction was in the range of 60% to 65%. Wall motion was normal; there were no regional wall motion abnormalities.       Transthoracic echocardiography. M-mode, complete 2D, spectral Doppler, and color Doppler. Height: Height: 162.6cm. Height: 64in. Weight: Weight: 87.7kg. Weight: 193lb. Body mass index: BMI: 33.2kg/m^2. Body surface area:  BSA: 2.53m^2. Blood pressure: 136/90. Patient status: Inpatient. Location: Emergency department.  ------------------------------------------------------------  ------------------------------------------------------------ Left ventricle: The cavity size was normal. Wall thickness was normal. Systolic function was normal. The estimated ejection fraction was in the range of 60% to 65%. Wall motion was normal; there were no regional wall motion abnormalities.  ------------------------------------------------------------ Aortic valve:  Structurally normal valve.  Cusp separation was normal. Doppler: Transvalvular velocity was within the normal range. There was no stenosis. No regurgitation.  ------------------------------------------------------------ Aorta: Aortic root: The aortic root was normal in size. Ascending aorta: The ascending aorta was normal in size.  ------------------------------------------------------------ Mitral valve:  Structurally normal valve.  Leaflet separation was normal. Doppler: Transvalvular velocity was within the normal range. There was no evidence for stenosis. No regurgitation.  ------------------------------------------------------------ Left atrium: The atrium was normal in size.  ------------------------------------------------------------ Right ventricle: The cavity size was normal. Systolic function was  normal.  ------------------------------------------------------------ Pulmonic valve:  Structurally normal valve.  Cusp separation was normal. Doppler: Transvalvular velocity was within the normal range. No regurgitation.  ------------------------------------------------------------ Tricuspid valve:  Structurally normal valve.  Leaflet separation was normal. Doppler: Transvalvular velocity was within the normal range. No regurgitation.  ------------------------------------------------------------ Right atrium: The atrium was normal in size.  ------------------------------------------------------------ Pericardium: There was no pericardial effusion.  ------------------------------------------------------------ Systemic  veins: Inferior vena cava: The vessel was normal in size; the respirophasic diameter changes were in the normal range (= 50%); findings are consistent with normal central venous pressure.  ------------------------------------------------------------  2D measurements    Normal Doppler measurements  Normal Left ventricle         Left ventricle LVID ED,  47.1 mm   43-52  Ea, lat ann, 11. cm/s ------ chord,             tiss DP     8 PLAX              E/Ea, lat   4.1   ------ LVID ES,  34.9 mm   23-38  ann, tiss DP chord,             Ea, med ann, 8.5 cm/s ------ PLAX              tiss DP     5 FS, chord,  26 %   >29   E/Ea, med   5.6   ------ PLAX              ann, tiss DP  6 LVPW, ED  8.72 mm   ------ Mitral valve IVS/LVPW  0.91    <1.3  Peak E vel  48. cm/s ------ ratio, ED                   4 Ventricular septum       Peak A vel  50. cm/s ------ IVS, ED  7.92 mm   ------         8 Aorta             Deceleration 317 ms  150-23 Root diam,  28 mm   ------ time           0 ED               Peak E/A    1   ------ Left atrium          ratio AP dim    29 mm   ------ AP dim   1.43 cm/m^2 <2.2 index  ------------------------------------------------------------ Prepared and Electronically Authenticated by  Mertie Moores 2014-11-19T16:48:52.507 Patient Information   Patient Name Elaine, [name] Sex Female DOB [birth year] SSN [id number] Reason for Exam  Priority: Routine  Comments:  Surgical History   Surgical History    No past medical history on file.  Other Surgical History    Procedure Laterality Date Comment Source AUGMENTATION MAMMAPLASTY Bilateral   Provider BREAST SURGERY    Provider COLONOSCOPY   polyps Provider egg donation   In her 45's. Provider WISDOM TOOTH EXTRACTION    Provider  Performing Technologist/Nurse   Performing Technologist/Nurse:  Implants    No active implants to display in this view. Order-Level Documents - 12/10/2012:   Scan on 12/11/2012 11:46 AM by Default, Provider, MDScan on 12/11/2012 11:46 AM by Default, Provider, MD    Encounter-Level Documents - 12/10/2012:   Electronic signature on 12/10/2012 5:22 PM - Signed  Electronic signature on 12/10/2012 11:13 AM - Signed    Printable Result Report    Result Report      Reproductive/Obstetrics                             Anesthesia Physical Anesthesia Plan  ASA: II  Anesthesia Plan: General   Post-op Pain Management:    Induction: Intravenous  PONV Risk Score and Plan: 4 or greater and Ondansetron, Dexamethasone, Scopolamine patch - Pre-op and Midazolam  Airway Management Planned: LMA  Additional Equipment:   Intra-op Plan:   Post-operative Plan: Extubation in OR  Informed Consent: I have reviewed the patients History and Physical, chart, labs and discussed the procedure including the risks, benefits and alternatives for the proposed anesthesia with the patient  or authorized representative who has indicated his/her understanding and acceptance.   Dental advisory given  Plan Discussed with: CRNA and Surgeon  Anesthesia Plan Comments:         Anesthesia Quick Evaluation

## 2017-11-13 NOTE — Op Note (Signed)
NAME: Elaine [name], Elaine [name] MEDICAL RECORD RC:[id number] ACCOUNT [id number] DATE OF BIRTH:May 19, 1965 FACILITY: [facility] LOCATION: [facility] PHYSICIAN:Shant Hence Al Decant, MD  OPERATIVE REPORT  DATE OF PROCEDURE:  11/13/2017  PREOPERATIVE DIAGNOSES:  Postmenopausal bleeding and endometrial mass.  POSTOPERATIVE DIAGNOSES:  Postmenopausal bleeding and endometrial mass, fibroid and polyp.  PROCEDURE:  Hysteroscopy, dilatation and curettage with hysteroscopic myomectomy via MyoSure.  SURGEON:  Thurnell Lose, MD  ASSISTANT:  None.  ANESTHESIA:  General and paracervical block.  ESTIMATED BLOOD LOSS:  Five mL.    FLUID DEFICIT:  Approximately 1400.  BLOOD ADMINISTERED:  None.  DRAINS:  None.  LOCAL:  Lidocaine 2 percent 10 mL and vasopressin 20 units and 50 of saline, 10 mL.  SPECIMENS:  Polyp and fibroid and clots, endometrial curettings.  DISPOSITION OF SPECIMEN:  To Pathology.  DISPOSITION:  To PACU hemodynamically stable.  COMPLICATIONS:  None.  FINDINGS:  Smooth endometrium at the fundus, shaggy endometrium at the lower uterine segment and fibroid on the left side.  DESCRIPTION OF PROCEDURE:  The patient was identified in the holding area.  She was then taken to the operating room with IV running.  She was placed in the dorsal lithotomy position and underwent general anesthesia without complication.  She was then  prepped and draped in a normal sterile fashion.  SCDs were on her legs and operating.  A timeout was performed.  A Graves speculum was inserted into the vagina.  The cervix was visualized and injected with lidocaine at the anterior lip of the cervix.  Single tooth tenaculum was then applied to the anterior lip of the cervix and a paracervical block was completed  for a total of 10 mL of 2% lidocaine.  The cervix was easily dilated with the os finder and then a Hegar up to a 6.  The hysteroscope was then advanced without complication.  Findings above were noted.  MyoSure was  then used to resect all of the shaggy  tissue and then on that lower left side as I was resecting some of the polypoid-appearing endometrium, a fibroid erupted and then that was removed.  I will probably stay, from what I could see, over 50% of the fibroid was removed.  The base was visible,  but could not reach it just based on the angle.  When this tension was released.  I could see that there was some bleeding from the area of the resection of the fibroid, so I did a curettage of the uterus and then I injected vasopressin.  Hemostasis was noted.  All instrument, sponge counts were correct x3.  The patient was taken to the recovery room in stable condition.  AN/NUANCE  D:11/13/2017 T:11/13/2017 JOB:[id number]

## 2017-11-13 NOTE — Interval H&P Note (Signed)
History and Physical Interval Note:  11/13/2017 11:31 AM  Elaine [name]  has presented today for surgery, with the diagnosis of N95.0 Post menopausal bleeding N94.89 Endometrial mass  The various methods of treatment have been discussed with the patient and family. After consideration of risks, benefits and other options for treatment, the patient has consented to  Procedure(s): [place] (N/A) as a surgical intervention .  The patient's history has been reviewed, patient examined, no change in status, stable for surgery.  I have reviewed the patient's chart and labs.  Questions were answered to the patient's satisfaction.    Cellulitis on right foot resolved.  Thurnell Lose

## 2017-11-13 NOTE — Anesthesia Postprocedure Evaluation (Signed)
Anesthesia Post Note  Patient: Elaine [name]  Procedure(s) Performed: DILATATION & CURETTAGE/HYSTEROSCOPY WITH MYOSURE (N/A )     Patient location during evaluation: PACU Anesthesia Type: General Level of consciousness: awake and sedated Pain management: pain level controlled Vital Signs Assessment: post-procedure vital signs reviewed and stable Respiratory status: spontaneous breathing Cardiovascular status: stable Postop Assessment: no apparent nausea or vomiting Anesthetic complications: no    Last Vitals:  Vitals:   11/13/17 1245 11/13/17 1300  BP: 115/73 129/80  Pulse: 65 65  Resp: 16 15  Temp:    SpO2: 96% 96%    Last Pain:  Vitals:   11/13/17 1300  TempSrc:   PainSc: 1    Pain Goal: Patients Stated Pain Goal: 4 (11/13/17 1300)               Huston Foley

## 2017-11-13 NOTE — Anesthesia Procedure Notes (Signed)
Procedure Name: LMA Insertion Date/Time: 11/13/2017 11:49 AM Performed by: Ignacia Bayley, CRNA Pre-anesthesia Checklist: Patient identified, Patient being monitored, Emergency Drugs available, Timeout performed and Suction available Patient Re-evaluated:Patient Re-evaluated prior to induction Oxygen Delivery Method: Circle System Utilized Preoxygenation: Pre-oxygenation with 100% oxygen Induction Type: IV induction Ventilation: Mask ventilation without difficulty LMA: LMA inserted LMA Size: 4.0 Number of attempts: 1 Placement Confirmation: positive ETCO2 and breath sounds checked- equal and bilateral Dental Injury: Teeth and Oropharynx as per pre-operative assessment

## 2017-11-13 NOTE — Discharge Instructions (Addendum)
Dilation and Curettage or Vacuum Curettage, Care After °This sheet gives you information about how to care for yourself after your procedure. Your health care provider may also give you more specific instructions. If you have problems or questions, contact your health care provider. °What can I expect after the procedure? °After your procedure, it is common to have: °· Mild pain or cramping. °· Some vaginal bleeding or spotting. ° °These may last for up to 2 weeks after your procedure. °Follow these instructions at home: °Activity ° °· Do not drive or use heavy machinery while taking prescription pain medicine. °· Avoid driving for the first 24 hours after your procedure. °· Take frequent, short walks, followed by rest periods, throughout the day. Ask your health care provider what activities are safe for you. After 1-2 days, you may be able to return to your normal activities. °· Do not lift anything heavier than 10 lb (4.5 kg) until your health care provider approves. °· For at least 2 weeks, or as long as told by your health care provider, do not: °? Douche. °? Use tampons. °? Have sexual intercourse. °General instructions ° °· Take over-the-counter and prescription medicines only as told by your health care provider. This is especially important if you take blood thinning medicine. °· Do not take baths, swim, or use a hot tub until your health care provider approves. Take showers instead of baths. °· Wear compression stockings as told by your health care provider. These stockings help to prevent blood clots and reduce swelling in your legs. °· It is your responsibility to get the results of your procedure. Ask your health care provider, or the department performing the procedure, when your results will be ready. °· Keep all follow-up visits as told by your health care provider. This is important. °Contact a health care provider if: °· You have severe cramps that get worse or that do not get better with  medicine. °· You have severe abdominal pain. °· You cannot drink fluids without vomiting. °· You develop pain in a different area of your pelvis. °· You have bad-smelling vaginal discharge. °· You have a rash. °Get help right away if: °· You have vaginal bleeding that soaks more than one sanitary pad in 1 hour, for 2 hours in a row. °· You pass large blood clots from your vagina. °· You have a fever that is above 100.4°F (38.0°C). °· Your abdomen feels very tender or hard. °· You have chest pain. °· You have shortness of breath. °· You cough up blood. °· You feel dizzy or light-headed. °· You faint. °· You have pain in your neck or shoulder area. °This information is not intended to replace advice given to you by your health care provider. Make sure you discuss any questions you have with your health care provider. °Document Released: 01/06/2000 Document Revised: 09/07/2015 Document Reviewed: 08/11/2015 °Elsevier Interactive Patient Education © 2018 [employer] ° ° °Post Anesthesia Home Care Instructions ° °Activity: °Get plenty of rest for the remainder of the day. A responsible individual must stay with you for 24 hours following the procedure.  °For the next 24 hours, DO NOT: °-Drive a car °-Operate machinery °-Drink alcoholic beverages °-Take any medication unless instructed by your physician °-Make any legal decisions or sign important papers. ° °Meals: °Start with liquid foods such as gelatin or soup. Progress to regular foods as tolerated. Avoid greasy, spicy, heavy foods. If nausea and/or vomiting occur, drink only clear liquids until the nausea   and/or vomiting subsides. Call your physician if vomiting continues. ° °Special Instructions/Symptoms: °Your throat may feel dry or sore from the anesthesia or the breathing tube placed in your throat during surgery. If this causes discomfort, gargle with warm salt water. The discomfort should disappear within 24 hours. ° °If you had a scopolamine patch placed  behind your ear for the management of post- operative nausea and/or vomiting: ° °1. The medication in the patch is effective for 72 hours, after which it should be removed.  Wrap patch in a tissue and discard in the trash. Wash hands thoroughly with soap and water. °2. You may remove the patch earlier than 72 hours if you experience unpleasant side effects which may include dry mouth, dizziness or visual disturbances. °3. Avoid touching the patch. Wash your hands with soap and water after contact with the patch. °  ° ° °

## 2017-11-13 NOTE — Brief Op Note (Signed)
11/13/2017  12:31 PM  PATIENT:  Elaine [name]  52 y.o. female  PRE-OPERATIVE DIAGNOSIS:  N95.0 Post menopausal bleeding N94.89 Endometrial mass  POST-OPERATIVE DIAGNOSIS:  Same, Fibroid dpolyp  PROCEDURE:  Procedure(s): DILATATION & CURETTAGE/HYSTEROSCOPY WITH MYOSURE (N/A)  Hysteroscopic myomectomy  SURGEON:  Surgeon(s) and Role:    Thurnell Lose, MD - Primary  PHYSICIAN ASSISTANT:   ASSISTANTS: none   ANESTHESIA:   general and paracervical block  EBL:  5 mL  Fluid deficit ~ 1400 ml  BLOOD ADMINISTERED:none  DRAINS: none   LOCAL MEDICATIONS USED:  2% LIDOCAINE , Amount: 10 ml and OTHER Vasopressin 20 units/50  10 ml   SPECIMEN:  Source of Specimen:  Polyp, Fibroid in sock, endometrial currettings  DISPOSITION OF SPECIMEN:  PATHOLOGY  COUNTS:  YES  TOURNIQUET:  * No tourniquets in log *  DICTATION: .Other Dictation: Dictation Number [id number]  PLAN OF CARE: Discharge to home after PACU  PATIENT DISPOSITION:  PACU - hemodynamically stable.   Delay start of Pharmacological VTE agent (>24hrs) due to surgical blood loss or risk of bleeding: Eliquis will resume in 18 hours.

## 2017-11-13 NOTE — Transfer of Care (Signed)
Immediate Anesthesia Transfer of Care Note  Patient: Elaine [name]  Procedure(s) Performed: DILATATION & CURETTAGE/HYSTEROSCOPY WITH MYOSURE (N/A )  Patient Location: PACU  Anesthesia Type:General  Level of Consciousness: sedated  Airway & Oxygen Therapy: Patient Spontanous Breathing and Patient connected to nasal cannula oxygen  Post-op Assessment: Report given to RN and Post -op Vital signs reviewed and stable  Post vital signs: stable  Last Vitals:  Vitals Value Taken Time  BP 118/71 11/13/2017 12:33 PM  Temp    Pulse 79 11/13/2017 12:36 PM  Resp 11 11/13/2017 12:36 PM  SpO2 97 % 11/13/2017 12:36 PM  Vitals shown include unvalidated device data.  Last Pain:  Vitals:   11/13/17 1106  TempSrc: Oral  PainSc: 1       Patients Stated Pain Goal: 4 (74/94/49 [id number])  Complications: No apparent anesthesia complications

## 2017-11-14 ENCOUNTER — Encounter (HOSPITAL_COMMUNITY): Payer: Self-pay | Admitting: Obstetrics and Gynecology

## 2017-11-19 ENCOUNTER — Other Ambulatory Visit: Payer: Self-pay | Admitting: Family Medicine

## 2017-11-19 DIAGNOSIS — Z1231 Encounter for screening mammogram for malignant neoplasm of breast: Secondary | ICD-10-CM

## 2017-12-10 ENCOUNTER — Ambulatory Visit
Admission: RE | Admit: 2017-12-10 | Discharge: 2017-12-10 | Disposition: A | Discharge status: Home / Self Care | Payer: BLUE CROSS/BLUE SHIELD | Source: Ambulatory Visit | Attending: Family Medicine | Admitting: Family Medicine

## 2017-12-10 DIAGNOSIS — Z1231 Encounter for screening mammogram for malignant neoplasm of breast: Secondary | ICD-10-CM | POA: Diagnosis not present

## 2018-02-03 ENCOUNTER — Telehealth: Payer: Self-pay | Admitting: Pharmacist

## 2018-02-03 NOTE — Telephone Encounter (Signed)
-----   Message from [place], LPN sent at 6/98/6148  9:15 AM EST ----- Pt cannot afford Eliquis. Per Truitt Merle pt needs to be transitioned to Coumadin. Thanks so much!

## 2018-02-03 NOTE — Telephone Encounter (Signed)
If not able to afford Eliquis, only option would be to return to coumadin with very close follow up.

## 2018-02-03 NOTE — Telephone Encounter (Signed)
LMOM to see if she has new insurance for 2020. I spoke with her pharmacy and her copay was $30 for a 3 month supply, last filled in October. Her BCBS commercial plan is no longer active. If she still has a Nature conservation officer, Eliquis copay should be fine.

## 2018-02-04 NOTE — Telephone Encounter (Signed)
Pt states she was laid off at the end of 2019 and is currently uninsured. She is going to look in to [facility] and I will look into [employer] Eliquis patient assistance.

## 2018-02-06 NOTE — Telephone Encounter (Signed)
Discussed pt case with drug rep - they have brought in enough Eliquis samples for pt to use until she becomes employed again and has insurance.

## 2018-02-06 NOTE — Telephone Encounter (Signed)
You are wonderful! Thank you so so much.

## 2018-05-07 DIAGNOSIS — S61257A Open bite of left little finger without damage to nail, initial encounter: Secondary | ICD-10-CM | POA: Diagnosis not present

## 2018-05-07 DIAGNOSIS — W540XXA Bitten by dog, initial encounter: Secondary | ICD-10-CM | POA: Diagnosis not present

## 2018-07-01 DIAGNOSIS — R1310 Dysphagia, unspecified: Secondary | ICD-10-CM | POA: Diagnosis not present

## 2018-07-01 DIAGNOSIS — R490 Dysphonia: Secondary | ICD-10-CM | POA: Diagnosis not present

## 2018-07-01 DIAGNOSIS — E049 Nontoxic goiter, unspecified: Secondary | ICD-10-CM | POA: Diagnosis not present

## 2018-08-05 DIAGNOSIS — K219 Gastro-esophageal reflux disease without esophagitis: Secondary | ICD-10-CM | POA: Diagnosis not present

## 2018-08-05 DIAGNOSIS — E042 Nontoxic multinodular goiter: Secondary | ICD-10-CM | POA: Diagnosis not present

## 2018-08-05 DIAGNOSIS — J302 Other seasonal allergic rhinitis: Secondary | ICD-10-CM | POA: Diagnosis not present

## 2018-08-05 DIAGNOSIS — R1313 Dysphagia, pharyngeal phase: Secondary | ICD-10-CM | POA: Diagnosis not present

## 2018-09-21 ENCOUNTER — Telehealth: Payer: Self-pay | Admitting: Medical

## 2018-09-21 MED ORDER — DILTIAZEM HCL ER BEADS 180 MG PO CP24: 180.0000 mg | ORAL_CAPSULE | Freq: Every evening | ORAL | 3 refills | Status: DC

## 2018-09-21 NOTE — Telephone Encounter (Signed)
Patient called requesting a refill of her diltiazem. Prescription sent to her preferred pharmacy. Patient appreciative of the assistance.   Abigail Butts, PA-C 09/21/18; 11:38 AM

## 2018-10-06 ENCOUNTER — Other Ambulatory Visit (HOSPITAL_COMMUNITY)
Admission: RE | Admit: 2018-10-06 | Discharge: 2018-10-06 | Disposition: A | Discharge status: Home / Self Care | Payer: BC Managed Care – PPO | Source: Ambulatory Visit | Attending: Obstetrics and Gynecology | Admitting: Obstetrics and Gynecology

## 2018-10-06 ENCOUNTER — Other Ambulatory Visit: Payer: Self-pay | Admitting: Obstetrics and Gynecology

## 2018-10-06 DIAGNOSIS — Z1231 Encounter for screening mammogram for malignant neoplasm of breast: Secondary | ICD-10-CM

## 2018-10-06 DIAGNOSIS — Z124 Encounter for screening for malignant neoplasm of cervix: Secondary | ICD-10-CM | POA: Insufficient documentation

## 2018-10-06 DIAGNOSIS — Z01411 Encounter for gynecological examination (general) (routine) with abnormal findings: Secondary | ICD-10-CM | POA: Diagnosis not present

## 2018-10-07 LAB — CYTOLOGY - PAP
Diagnosis: NEGATIVE
HPV: NOT DETECTED

## 2018-10-08 DIAGNOSIS — R1313 Dysphagia, pharyngeal phase: Secondary | ICD-10-CM | POA: Diagnosis not present

## 2018-10-08 DIAGNOSIS — K219 Gastro-esophageal reflux disease without esophagitis: Secondary | ICD-10-CM | POA: Diagnosis not present

## 2018-10-24 NOTE — Progress Notes (Signed)
CARDIOLOGY OFFICE NOTE  Date:  10/28/2018    [name] Date of Birth: Jul 08, 1965 Medical Record [id number]  PCP:  Cari Caraway, MD  Cardiologist:  Servando Snare    Chief Complaint  Patient presents with  . Follow-up    History of Present Illness: Elaine [name] is a 53 y.o. female who presents today for a one year check.  Former patient of Dr. Sherryl Barters. She primarilyfollows with me.  She has a history of PAF -seen in [facility] back in November 2014 with paroxysmal atrial fibrillation. She converted on IV diltiazem. Her echocardiogram at that time was normal. CHADSVASC score was 2 for hypertension and female sex. She was started on warfarin and then switched to NOAC. Other issues include past anemia, obesity,HLD and HTN. Negative OSA by sleep study noted.   She was doing quite well when I saw her in April of 2018 - had gotten married and had previously been really successful with losing weight (bought a dress that was 2 sizes too small). She was doing well. Had started working from home and this had really impacted her ability to exercise and her weight had gone back up but when seen in January of 2019 - back off track and had gained weight - lots of stress with the caregiver role. CCB had been increased due to breakthru AF. I last saw her in October of 2019 - had been laid off - fleeting palpitations. Was to have a D&C for vaginal bleeding.   The patient does not have symptoms concerning for COVID-19 infection (fever, chills, cough, or new shortness of breath).   Comes in today. Here alone. She is working now for [employer]. 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 They got off track with diet - she has gained weight. Having more issues with her AF - goes back to January - was at a wedding - did some dancing - had elevated HR that took several hours to break. BP is tracking up. She feels like she needs more CCB. She did just get a treadmill - now walking  about 20 minutes a day. No chest pain. Breathing is good. No bleeding/bruising. No recent labs.   Past Medical History:  Diagnosis Date  . Anemia   . Arthritis    hands, ankles, knees  . Atrial fibrillation ([place])    a. Formal dx 12/10/12 - suspected paroxysmal.  . Dyspnea    occasion with exertion   . GERD (gastroesophageal reflux disease)    diet control  . Goiter    labs within normal - MD just watching  . HTN (hypertension)    a. Formally dx 11/2012 - mild.  . Hypercholesterolemia    Borderline - not requiring medicines  . Seasonal allergies   . Snoring 12/03/2014  . SVD (spontaneous vaginal delivery)    x 2    Past Surgical History:  Procedure Laterality Date  . AUGMENTATION MAMMAPLASTY Bilateral   . BREAST SURGERY    . COLONOSCOPY     polyps  . DILATATION & CURETTAGE/HYSTEROSCOPY WITH MYOSURE N/A 11/13/2017   Procedure: DILATATION & CURETTAGE/HYSTEROSCOPY WITH MYOSURE;  Surgeon: Thurnell Lose, MD;  Location: [facility];  Service: Gynecology;  Laterality: N/A;  . egg donation     In her 74's.  . WISDOM TOOTH EXTRACTION       Medications: Current Meds  Medication Sig  . acetaminophen (TYLENOL) 500 MG tablet Take 1,000 mg by mouth every 6 (  six) hours as needed for mild pain.   Marland Kitchen apixaban (ELIQUIS) 5 MG TABS tablet Take 1 tablet (5 mg total) by mouth 2 (two) times daily.  . carboxymethylcellulose (REFRESH PLUS) 0.5 % SOLN Place 2 drops into both eyes 3 (three) times daily as needed (dry eyes).   . cholecalciferol (VITAMIN D) 1000 UNITS tablet Take 1,000 Units by mouth daily.  . cyanocobalamin 100 MCG tablet Take 100 mcg by mouth daily.  Marland Kitchen docusate sodium (COLACE) 100 MG capsule Take 100 mg by mouth every other day.  Marland Kitchen MAGNESIUM PO Take 2 tablets by mouth every evening.  . Misc Natural Products (TART CHERRY ADVANCED PO) Take 1 capsule by mouth daily.  . Multiple Vitamins-Minerals (ADULT GUMMY PO) Take 2 tablets by mouth daily.  . pantoprazole (PROTONIX) 40 MG tablet  Take 40 mg by mouth daily.  . Probiotic Product (PROBIOTIC PO) Take 1 capsule by mouth every evening.  . [DISCONTINUED] diltiazem (TIAZAC) 180 MG 24 hr capsule Take 1 capsule (180 mg total) by mouth every evening.     Allergies: Allergies  Allergen Reactions  . Other Other (See Comments)    Raw Almonds, itching   Peaches, itching  . Ampicillin Rash    Has taken amoxicillin without any reaction    Social History: The patient  reports that she has never smoked. She has never used smokeless tobacco. She reports current alcohol use of about 1.0 standard drinks of alcohol per week. She reports that she does not use drugs.   Family History: The patient's family history includes Aneurysm in her mother; Atrial fibrillation in her father and unknown relative; Breast cancer in her cousin and maternal grandmother; CAD in her paternal uncle; Migraines in her sister; Peripheral vascular disease in her mother; Stroke in her mother.   Review of Systems: Please see the history of present illness.   All other systems are reviewed and negative.   Physical Exam: VS:  BP 130/90   Pulse 78   Ht 5\' 4"  (1.626 m)   Wt 209 lb (94.8 kg)   LMP  (LMP Unknown)   SpO2 98%   BMI 35.87 kg/m  .  BMI Body mass index is 35.87 kg/m.  Wt Readings from Last 3 Encounters:  10/28/18 209 lb (94.8 kg)  11/06/17 205 lb 6 oz (93.2 kg)  10/29/17 204 lb (92.5 kg)    General: Pleasant. Obese. Alert and in no acute distress.   HEENT: Normal.  Neck: Supple, no JVD, carotid bruits, or masses noted.  Cardiac: Regular rate and rhythm. No murmurs, rubs, or gallops. No edema.  Respiratory:  Lungs are clear to auscultation bilaterally with normal work of breathing.  GI: Soft and nontender.  MS: No deformity or atrophy. Gait and ROM intact.  Skin: Warm and dry. Color is normal.  Neuro:  Strength and sensation are intact and no gross focal deficits noted.  Psych: Alert, appropriate and with normal affect.   LABORATORY  DATA:  EKG:  EKG is ordered today. This demonstrates NSR.  Lab Results  Component Value Date   WBC 7.9 11/06/2017   HGB 13.3 11/06/2017   HCT 39.7 11/06/2017   PLT 382 11/06/2017   GLUCOSE 96 11/06/2017   CHOL 215 (H) 03/29/2015   TRIG 106 03/29/2015   HDL 67 03/29/2015   LDLCALC 127 03/29/2015   ALT 15 10/29/2017   AST 17 10/29/2017   NA 137 11/06/2017   K 4.4 11/06/2017   CL 101 11/06/2017   CREATININE  1.33 (H) 11/06/2017   BUN 18 11/06/2017   CO2 25 11/06/2017   TSH 1.224 12/10/2012   INR 1.09 11/06/2017         BNP (last 3 results) No results for input(s): BNP in the last 8760 hours.  ProBNP (last 3 results) No results for input(s): PROBNP in the last 8760 hours.   Other Studies Reviewed Today:  Echo Study Conclusions from 2014  Left ventricle: The cavity size was normal. Wall thickness was normal. Systolic function was normal. The estimated ejection fraction was in the range of 60% to 65%. Wall motion was normal; there were no regional wall motion abnormalities.     Assessment/Plan:  1. PAF - with CHADSVASC of 2 - has had worsening palpitations - increasing her CCB today - hopefully with getting back on track this will be short term and we can titrate her med back down.   2. HTN - BP is trending up - see #1.   3. Obesity - long discussion with my suggestions given today. Lab today.   4. Chronic anticoagulation - no issues noted - lab today.   5. COVID-19 Education: The signs and symptoms of COVID-19 were discussed with the patient and how to seek care for testing (follow up with PCP or arrange E-visit).  The importance of social distancing, staying at home, hand hygiene and wearing a mask when out in public were discussed today.  Current medicines are reviewed with the patient today.  The patient does not have concerns regarding medicines other than what has been noted above.  The following changes have been made:  See above.  Labs/ tests  ordered today include:    Orders Placed This Encounter  Procedures  . Basic metabolic panel  . CBC  . Hepatic function panel  . Lipid panel  . TSH  . EKG 12-Lead     Disposition:   FU with me in 4 months.   Patient is agreeable to this plan and will call if any problems develop in the interim.   SignedTruitt Merle, NP  10/28/2018 9:40 AM  [facility] [address] [place], [place]  [address] Phone: [phone] Fax: [phone]

## 2018-10-28 ENCOUNTER — Other Ambulatory Visit: Payer: Self-pay

## 2018-10-28 ENCOUNTER — Encounter: Payer: Self-pay | Admitting: Nurse Practitioner

## 2018-10-28 ENCOUNTER — Ambulatory Visit: Payer: BC Managed Care – PPO | Admitting: Nurse Practitioner

## 2018-10-28 VITALS — BP 130/90 | HR 78 | Ht 64.0 in | Wt 209.0 lb

## 2018-10-28 DIAGNOSIS — Z23 Encounter for immunization: Secondary | ICD-10-CM

## 2018-10-28 DIAGNOSIS — I119 Hypertensive heart disease without heart failure: Secondary | ICD-10-CM

## 2018-10-28 DIAGNOSIS — Z7901 Long term (current) use of anticoagulants: Secondary | ICD-10-CM

## 2018-10-28 DIAGNOSIS — I480 Paroxysmal atrial fibrillation: Secondary | ICD-10-CM | POA: Diagnosis not present

## 2018-10-28 DIAGNOSIS — E7849 Other hyperlipidemia: Secondary | ICD-10-CM

## 2018-10-28 DIAGNOSIS — Z7189 Other specified counseling: Secondary | ICD-10-CM

## 2018-10-28 LAB — CBC
Hematocrit: 38.7 % (ref 34.0–46.6)
Hemoglobin: 13.1 g/dL (ref 11.1–15.9)
MCH: 30.9 pg (ref 26.6–33.0)
MCHC: 33.9 g/dL (ref 31.5–35.7)
MCV: 91 fL (ref 79–97)
Platelets: 372 10*3/uL (ref 150–450)
RBC: 4.24 x10E6/uL (ref 3.77–5.28)
RDW: 12.8 % (ref 11.7–15.4)
WBC: 7.7 10*3/uL (ref 3.4–10.8)

## 2018-10-28 LAB — LIPID PANEL
Chol/HDL Ratio: 3.9 ratio (ref 0.0–4.4)
Cholesterol, Total: 227 mg/dL — ABNORMAL HIGH (ref 100–199)
HDL: 58 mg/dL (ref >39)
LDL Chol Calc (NIH): 141 mg/dL — ABNORMAL HIGH (ref 0–99)
Triglycerides: 158 mg/dL — ABNORMAL HIGH (ref 0–149)
VLDL Cholesterol Cal: 28 mg/dL (ref 5–40)

## 2018-10-28 LAB — HEPATIC FUNCTION PANEL
ALT: 9 IU/L (ref 0–32)
AST: 14 IU/L (ref 0–40)
Albumin: 4.5 g/dL (ref 3.8–4.9)
Alkaline Phosphatase: 90 IU/L (ref 39–117)
Bilirubin Total: 0.2 mg/dL (ref 0.0–1.2)
Bilirubin, Direct: 0.07 mg/dL (ref 0.00–0.40)
Total Protein: 7.3 g/dL (ref 6.0–8.5)

## 2018-10-28 LAB — TSH: TSH: 1.22 u[IU]/mL (ref 0.450–4.500)

## 2018-10-28 LAB — BASIC METABOLIC PANEL
BUN/Creatinine Ratio: 12 (ref 9–23)
BUN: 12 mg/dL (ref 6–24)
CO2: 23 mmol/L (ref 20–29)
Calcium: 9.8 mg/dL (ref 8.7–10.2)
Chloride: 104 mmol/L (ref 96–106)
Creatinine, Ser: 1.02 mg/dL — ABNORMAL HIGH (ref 0.57–1.00)
GFR calc Af Amer: 73 mL/min/{1.73_m2} (ref >59)
GFR calc non Af Amer: 63 mL/min/{1.73_m2} (ref >59)
Glucose: 94 mg/dL (ref 65–99)
Potassium: 4.8 mmol/L (ref 3.5–5.2)
Sodium: 138 mmol/L (ref 134–144)

## 2018-10-28 MED ORDER — DILTIAZEM HCL ER COATED BEADS 240 MG PO CP24: 240.0000 mg | ORAL_CAPSULE | Freq: Every day | ORAL | 3 refills | Status: DC

## 2018-10-28 NOTE — Patient Instructions (Addendum)
After Visit Summary:  We will be checking the following labs today - BMET, CBC, HPF, Lipids and TSH   Medication Instructions:    Continue with your current medicines.   I am going to increase the Diltiazem to 240 mg a day - this is at your pharmacy   If you need a refill on your cardiac medications before your next appointment, please call your pharmacy.     Testing/Procedures To Be Arranged:  N/A  Follow-Up:   See me in about 4 months     At [facility], you and your health needs are our priority.  As part of our continuing mission to provide you with exceptional heart care, we have created designated Provider Care Teams.  These Care Teams include your primary Cardiologist (physician) and Advanced Practice Providers (APPs -  Physician Assistants and Nurse Practitioners) who all work together to provide you with the care you need, when you need it.  Special Instructions:  . Stay safe, stay home, wash your hands for at least 20 seconds and wear a mask when out in public.  . It was good to talk with you today. . Think about what we talked about today.  . Flu shot today    Call the [place] office at [phone] if you have any questions, problems or concerns.

## 2018-12-15 ENCOUNTER — Ambulatory Visit
Admission: RE | Admit: 2018-12-15 | Discharge: 2018-12-15 | Disposition: A | Discharge status: Home / Self Care | Payer: BC Managed Care – PPO | Source: Ambulatory Visit | Attending: Obstetrics and Gynecology | Admitting: Obstetrics and Gynecology

## 2018-12-15 ENCOUNTER — Other Ambulatory Visit: Payer: Self-pay

## 2018-12-15 DIAGNOSIS — Z1231 Encounter for screening mammogram for malignant neoplasm of breast: Secondary | ICD-10-CM

## 2018-12-25 ENCOUNTER — Other Ambulatory Visit: Payer: Self-pay | Admitting: Nurse Practitioner

## 2018-12-25 NOTE — Telephone Encounter (Signed)
Pt last saw Truitt Merle, NP on 10/28/18, last labs 10/28/18 Creat 1.02, age 53, weight 94.8, based on specified criteria pt is on appropriate dosage of Eliquis 5mg  BID.  Will refill rx.

## 2018-12-29 DIAGNOSIS — Z0000 Encounter for general adult medical examination without abnormal findings: Secondary | ICD-10-CM | POA: Diagnosis not present

## 2019-02-17 ENCOUNTER — Ambulatory Visit: Payer: BC Managed Care – PPO | Admitting: Nurse Practitioner

## 2019-03-04 ENCOUNTER — Ambulatory Visit: Payer: BC Managed Care – PPO | Admitting: Nurse Practitioner

## 2019-03-12 NOTE — Progress Notes (Addendum)
CARDIOLOGY OFFICE NOTE  Date:  03/18/2019    [name] Date of Birth: 07-07-1965 Medical Record [id number]  PCP:  Cari Caraway, MD  Cardiologist:  Servando Snare   Chief Complaint  Patient presents with  . Follow-up    History of Present Illness: Elaine [name] is a 54 y.o. female who presents today for a follow up visit.  Former patient of Dr. Sherryl [name]. She primarilyfollows with me.  She has a history of PAF -seen in [name] ER back in November 2014 with paroxysmal atrial fibrillation. She converted on IV diltiazem. Her echocardiogram at that time was normal. CHADSVASC score was 2 for hypertension and female sex. She was started on warfarin and then switched to [facility]. Other issues include past anemia, obesity,HLD and HTN. Negative OSA by sleep study noted.   I have followed her since - she has done ok - some breakthru AF. She has struggled with CV risk factor modification. Last seen in October and noted more PAF.   The patient does not have symptoms concerning for COVID-19 infection (fever, chills, cough, or new shortness of breath).   Comes in today. Here alone. She notes that she has moved to more of a Mediterranean diet - this has helped her gut. Now off PPI therapy.. She has started exercising on her treadmill - more regularly for the past 2 months - does note that her HR remains elevated for several hours - in the 110's - no AF that she is aware of - legs feel weak - not presyncopal and no chest pain - but worrisome for her. She has an Visual merchandiser - reads "inconclusive" during these spells. She feels like she hydrates - but does exercise first thing in the morning. She takes her Diltiazem at night. She is fasting today.  BP has been doing very well.   Past Medical History:  Diagnosis Date  . Anemia   . Arthritis    hands, ankles, knees  . Atrial fibrillation ([place])    a. Formal dx 12/10/12 - suspected paroxysmal.  . Dyspnea    occasion with exertion   .  GERD (gastroesophageal reflux disease)    diet control  . Goiter    labs within normal - MD just watching  . HTN (hypertension)    a. Formally dx 11/2012 - mild.  . Hypercholesterolemia    Borderline - not requiring medicines  . Seasonal allergies   . Snoring 12/03/2014  . SVD (spontaneous vaginal delivery)    x 2    Past Surgical History:  Procedure Laterality Date  . AUGMENTATION MAMMAPLASTY Bilateral   . BREAST SURGERY    . COLONOSCOPY     polyps  . DILATATION & CURETTAGE/HYSTEROSCOPY WITH MYOSURE N/A 11/13/2017   Procedure: DILATATION & CURETTAGE/HYSTEROSCOPY WITH MYOSURE;  Surgeon: Thurnell Lose, MD;  Location: [facility];  Service: Gynecology;  Laterality: N/A;  . egg donation     In her 66's.  . WISDOM TOOTH EXTRACTION       Medications: Current Meds  Medication Sig  . acetaminophen (TYLENOL) 500 MG tablet Take 1,000 mg by mouth every 6 (six) hours as needed for mild pain.   . carboxymethylcellulose (REFRESH PLUS) 0.5 % SOLN Place 2 drops into both eyes 3 (three) times daily as needed (dry eyes).   . cholecalciferol (VITAMIN D) 1000 UNITS tablet Take 1,000 Units by mouth daily.  . cyanocobalamin 100 MCG tablet Take 100 mcg by mouth daily.  Marland Kitchen docusate sodium (COLACE)  100 MG capsule Take 100 mg by mouth every other day.  Marland Kitchen ELIQUIS 5 MG TABS tablet TAKE 1 TABLET BY MOUTH TWICE A DAY  . MAGNESIUM PO Take 2 tablets by mouth every evening.  . Misc Natural Products (TART CHERRY ADVANCED PO) Take 1 capsule by mouth daily.  . Multiple Vitamins-Minerals (ADULT GUMMY PO) Take 2 tablets by mouth daily.  . Probiotic Product (PROBIOTIC PO) Take 1 capsule by mouth every evening.  . [DISCONTINUED] pantoprazole (PROTONIX) 40 MG tablet Take 40 mg by mouth daily.     Allergies: Allergies  Allergen Reactions  . Other Other (See Comments)    Raw Almonds, itching   Peaches, itching  . Ampicillin Rash    Has taken amoxicillin without any reaction    Social History: The patient   reports that she has never smoked. She has never used smokeless tobacco. She reports current alcohol use of about 1.0 standard drinks of alcohol per week. She reports that she does not use drugs.   Family History: The patient's family history includes Aneurysm in her mother; Atrial fibrillation in her father and another family member; Breast cancer in her cousin and maternal grandmother; CAD in her paternal uncle; Migraines in her sister; Peripheral vascular disease in her mother; Stroke in her mother.   Review of Systems: Please see the history of present illness.   All other systems are reviewed and negative.   Physical Exam: VS:  BP 116/80   Pulse 96   Ht 5\' 4"  (1.626 m)   Wt 206 lb (93.4 kg)   LMP  (LMP Unknown)   SpO2 98%   BMI 35.36 kg/m  .  BMI Body mass index is 35.36 kg/m.  Wt Readings from Last 3 Encounters:  03/18/19 206 lb (93.4 kg)  10/28/18 209 lb (94.8 kg)  11/06/17 205 lb 6 oz (93.2 kg)    General: Alert and in no acute distress. She is obese.   Cardiac: Regular rate and rhythm. HR in the 90's on my exam as well. No murmurs, rubs, or gallops. No edema.  Respiratory:  Lungs are clear to auscultation bilaterally with normal work of breathing.  Skin: Warm and dry. Color is normal.  Neuro:  Strength and sensation are intact and no gross focal deficits noted.  Psych: Alert, appropriate and with normal affect.   LABORATORY DATA:  EKG:  EKG is not ordered today.  Lab Results  Component Value Date   WBC 7.7 10/28/2018   HGB 13.1 10/28/2018   HCT 38.7 10/28/2018   PLT 372 10/28/2018   GLUCOSE 94 10/28/2018   CHOL 227 (H) 10/28/2018   TRIG 158 (H) 10/28/2018   HDL 58 10/28/2018   LDLCALC 141 (H) 10/28/2018   ALT 9 10/28/2018   AST 14 10/28/2018   NA 138 10/28/2018   K 4.8 10/28/2018   CL 104 10/28/2018   CREATININE 1.02 (H) 10/28/2018   BUN 12 10/28/2018   CO2 23 10/28/2018   TSH 1.220 10/28/2018   INR 1.09 11/06/2017     BNP (last 3 results) No  results for input(s): BNP in the last 8760 hours.  ProBNP (last 3 results) No results for input(s): PROBNP in the last 8760 hours.   Other Studies Reviewed Today:  Echo Study Conclusions from 2014  Left ventricle: The cavity size was normal. Wall thickness was normal. Systolic function was normal. The estimated ejection fraction was in the range of 60% to 65%. Wall motion was normal; there were no  regional wall motion abnormalities.     Assessment/Plan:  1. Elevated HR with exercise that is persistent - probably multifactorial (inadequate hydration, deconditioning, etc) - will arrange for GXT - she is NOT to hold her medicines for this as these spells happen on her medicine. Check lab today.   2. PAF - see above - in sinus by exam today.   3. Chronic anticoagulation - no problems noted.   4. HTN - BP is great on her current regimen.   5. Obesity - she has had a change in her diet - now exercising - will arrange the GXT to rule out any arrhythmia.   6. COVID-19 Education: The signs and symptoms of COVID-19 were discussed with the patient and how to seek care for testing (follow up with PCP or arrange E-visit).  The importance of social distancing, staying at home, hand hygiene and wearing a mask when out in public were discussed today.  Current medicines are reviewed with the patient today.  The patient does not have concerns regarding medicines other than what has been noted above.  The following changes have been made:  See above.  Labs/ tests ordered today include:    Orders Placed This Encounter  Procedures  . Basic metabolic panel  . CBC  . Hepatic function panel  . Lipid panel  . TSH  . EXERCISE TOLERANCE TEST (ETT)     Disposition:   FU with me in 4 months.   Patient is agreeable to this plan and will call if any problems develop in the interim.   SignedTruitt Merle, NP  03/18/2019 8:50 AM  [facility] [address] [place], [place]  [address] Phone: [phone] Fax: [phone]       03/18/19 Addendum: After I left the exam room, while she was waiting for her GXT and appointment to be arranged, she got very hot and sweaty, felt nauseated and did vomit. Had taken her vitamins this morning on an empty stomach. BP 90/60. EKG with NSR - HR 89.   She was given a can of Coke - felt better. BP up to 130/90.   She was observed for additional 30 minutes and was fine.   Will proceed with plan as outlined above.   Burtis Junes, RN, [facility] [address] [place], [place]  [address] [phone]

## 2019-03-18 ENCOUNTER — Encounter: Payer: Self-pay | Admitting: Nurse Practitioner

## 2019-03-18 ENCOUNTER — Other Ambulatory Visit: Payer: Self-pay

## 2019-03-18 ENCOUNTER — Ambulatory Visit: Payer: Managed Care, Other (non HMO) | Admitting: Nurse Practitioner

## 2019-03-18 VITALS — BP 116/80 | HR 96 | Ht 64.0 in | Wt 206.0 lb

## 2019-03-18 DIAGNOSIS — I480 Paroxysmal atrial fibrillation: Secondary | ICD-10-CM

## 2019-03-18 DIAGNOSIS — R000 Tachycardia, unspecified: Secondary | ICD-10-CM

## 2019-03-18 DIAGNOSIS — Z7901 Long term (current) use of anticoagulants: Secondary | ICD-10-CM

## 2019-03-18 DIAGNOSIS — R55 Syncope and collapse: Secondary | ICD-10-CM | POA: Diagnosis not present

## 2019-03-18 DIAGNOSIS — E7849 Other hyperlipidemia: Secondary | ICD-10-CM

## 2019-03-18 DIAGNOSIS — I119 Hypertensive heart disease without heart failure: Secondary | ICD-10-CM

## 2019-03-18 NOTE — Addendum Note (Signed)
Addended by: Burtis Junes on: 03/18/2019 09:29 AM   Modules accepted: Orders

## 2019-03-18 NOTE — Patient Instructions (Addendum)
After Visit Summary:  We will be checking the following labs today - BMET, CBC, HPF, Lipids and TSH   Medication Instructions:    Continue with your current medicines for now.    If you need a refill on your cardiac medications before your next appointment, please call your pharmacy.     Testing/Procedures To Be Arranged:  GXT  You are scheduled for an Exercise Stress Test on __________________________ at _____________________________.   Please arrive 15 minutes prior to your appointment time for registration and insurance purposes.   The test will take approximately 45 minutes to complete.   How to prepare for your Exercise Stress Test:    Do bring a list of your current medications with you. If not listed below, you may take your medications as normal.    Bring any medication held to your appointment as you may be required to take it once the test is complete.   Do wear comfortable closes (no dresses or overalls) and walking shoes - tennis shoes are preferred. No open toed shoes or heels.  Do not wear cologne, perfume, aftershave or lotions (deodorant is allowed).  Please report to the [place] [facility] for your test.   If you have questions or concerns about your appointment, you can call the Exercise Stress Lab at [phone].   If you cannot keep your appointment, please provide 24 hours notification to the Exercise Stress Lab to avoid a possible $50 charge to your account.                     Follow-Up:   See me in about 4 months.     At [facility], you and your health needs are our priority.  As part of our continuing mission to provide you with exceptional heart care, we have created designated Provider Care Teams.  These Care Teams include your primary Cardiologist (physician) and Advanced Practice Providers (APPs -  Physician Assistants and Nurse Practitioners) who all work together to provide you with the care you need, when you need  it.  Special Instructions:  . Stay safe, stay home, wash your hands for at least 20 seconds and wear a mask when out in public.  . It was good to talk with you today.    Call the [place] office at [phone] if you have any questions, problems or concerns.

## 2019-03-18 NOTE — Addendum Note (Signed)
Addended by: Lanna Poche R on: 03/18/2019 09:10 AM   Modules accepted: Orders

## 2019-03-19 LAB — BASIC METABOLIC PANEL
BUN/Creatinine Ratio: 12 (ref 9–23)
BUN: 13 mg/dL (ref 6–24)
CO2: 20 mmol/L (ref 20–29)
Calcium: 9.7 mg/dL (ref 8.7–10.2)
Chloride: 102 mmol/L (ref 96–106)
Creatinine, Ser: 1.05 mg/dL — ABNORMAL HIGH (ref 0.57–1.00)
GFR calc Af Amer: 70 mL/min/{1.73_m2} (ref >59)
GFR calc non Af Amer: 60 mL/min/{1.73_m2} (ref >59)
Glucose: 116 mg/dL — ABNORMAL HIGH (ref 65–99)
Potassium: 4.6 mmol/L (ref 3.5–5.2)
Sodium: 138 mmol/L (ref 134–144)

## 2019-03-19 LAB — HEPATIC FUNCTION PANEL
ALT: 13 IU/L (ref 0–32)
AST: 16 IU/L (ref 0–40)
Albumin: 4.5 g/dL (ref 3.8–4.9)
Alkaline Phosphatase: 98 IU/L (ref 39–117)
Bilirubin Total: 0.2 mg/dL (ref 0.0–1.2)
Bilirubin, Direct: 0.08 mg/dL (ref 0.00–0.40)
Total Protein: 7.4 g/dL (ref 6.0–8.5)

## 2019-03-19 LAB — LIPID PANEL
Chol/HDL Ratio: 3.6 ratio (ref 0.0–4.4)
Cholesterol, Total: 247 mg/dL — ABNORMAL HIGH (ref 100–199)
HDL: 69 mg/dL (ref >39)
LDL Chol Calc (NIH): 151 mg/dL — ABNORMAL HIGH (ref 0–99)
Triglycerides: 151 mg/dL — ABNORMAL HIGH (ref 0–149)
VLDL Cholesterol Cal: 27 mg/dL (ref 5–40)

## 2019-03-19 LAB — CBC
Hematocrit: 40.6 % (ref 34.0–46.6)
Hemoglobin: 13.9 g/dL (ref 11.1–15.9)
MCH: 30.5 pg (ref 26.6–33.0)
MCHC: 34.2 g/dL (ref 31.5–35.7)
MCV: 89 fL (ref 79–97)
Platelets: 404 10*3/uL (ref 150–450)
RBC: 4.56 x10E6/uL (ref 3.77–5.28)
RDW: 12.5 % (ref 11.7–15.4)
WBC: 9.5 10*3/uL (ref 3.4–10.8)

## 2019-03-19 LAB — TSH: TSH: 1.11 u[IU]/mL (ref 0.450–4.500)

## 2019-03-23 ENCOUNTER — Other Ambulatory Visit: Payer: Self-pay | Admitting: *Deleted

## 2019-03-23 DIAGNOSIS — E785 Hyperlipidemia, unspecified: Secondary | ICD-10-CM

## 2019-03-26 ENCOUNTER — Telehealth: Payer: Self-pay

## 2019-03-26 ENCOUNTER — Other Ambulatory Visit: Payer: Self-pay

## 2019-03-26 MED ORDER — APIXABAN 5 MG PO TABS: 5.0000 mg | ORAL_TABLET | Freq: Two times a day (BID) | ORAL | 3 refills | Status: DC

## 2019-03-26 NOTE — Telephone Encounter (Signed)
I did an Eliquis PA through covermymeds and received the following message: Allexandra Bertling Key: [id number] - PA Case ID: [id number]   Outcome  Approved today  Case [id number] Review Type:Prior Auth;Coverage Start Date:03/26/2019;Coverage End Date:03/25/2020 DrugEliquis 5MG  tablets  [employer] Electronic PA Form (2017 NCPDP)  I have notified [facility] and the pt of this approval.

## 2019-03-26 NOTE — Telephone Encounter (Signed)
Eliquis PA done though covermymeds and it was approved:  Kristine Linea Key: [id number] - PA Case ID: [id number]  Outcome  Approved today  CaseId: [id number]  Review Type: Prior Auth; Coverage Start Date:03/26/2019;Coverage End Date:03/25/2020 DrugEliquis 5MG  tablets  [employer] Electronic PA Form [phone] NCPDP)   I have notified the pt and [facility] of this approval.

## 2019-04-10 ENCOUNTER — Other Ambulatory Visit (HOSPITAL_COMMUNITY)
Admission: RE | Admit: 2019-04-10 | Discharge: 2019-04-10 | Disposition: A | Discharge status: Home / Self Care | Payer: Managed Care, Other (non HMO) | Source: Ambulatory Visit | Attending: Nurse Practitioner | Admitting: Nurse Practitioner

## 2019-04-10 DIAGNOSIS — Z01812 Encounter for preprocedural laboratory examination: Secondary | ICD-10-CM | POA: Diagnosis present

## 2019-04-10 DIAGNOSIS — Z20822 Contact with and (suspected) exposure to covid-19: Secondary | ICD-10-CM | POA: Diagnosis not present

## 2019-04-10 LAB — SARS CORONAVIRUS 2 (TAT 6-24 HRS): SARS Coronavirus 2: NEGATIVE

## 2019-04-14 ENCOUNTER — Ambulatory Visit (INDEPENDENT_AMBULATORY_CARE_PROVIDER_SITE_OTHER): Payer: Managed Care, Other (non HMO)

## 2019-04-14 ENCOUNTER — Ambulatory Visit (INDEPENDENT_AMBULATORY_CARE_PROVIDER_SITE_OTHER)
Admission: RE | Admit: 2019-04-14 | Discharge: 2019-04-14 | Disposition: A | Discharge status: Home / Self Care | Payer: Self-pay | Source: Ambulatory Visit | Attending: Nurse Practitioner | Admitting: Nurse Practitioner

## 2019-04-14 ENCOUNTER — Other Ambulatory Visit: Payer: Self-pay

## 2019-04-14 DIAGNOSIS — E785 Hyperlipidemia, unspecified: Secondary | ICD-10-CM

## 2019-04-14 DIAGNOSIS — I480 Paroxysmal atrial fibrillation: Secondary | ICD-10-CM

## 2019-04-14 DIAGNOSIS — E7849 Other hyperlipidemia: Secondary | ICD-10-CM

## 2019-04-14 DIAGNOSIS — I119 Hypertensive heart disease without heart failure: Secondary | ICD-10-CM

## 2019-04-14 DIAGNOSIS — Z7901 Long term (current) use of anticoagulants: Secondary | ICD-10-CM | POA: Diagnosis not present

## 2019-04-14 DIAGNOSIS — R000 Tachycardia, unspecified: Secondary | ICD-10-CM

## 2019-04-14 LAB — EXERCISE TOLERANCE TEST
Estimated workload: 7 METS
Exercise duration (min): 6 min
Exercise duration (sec): 0 s
MPHR: 166 {beats}/min
Peak HR: 150 {beats}/min
Percent HR: 90 %
Rest HR: 77 {beats}/min

## 2019-07-02 ENCOUNTER — Other Ambulatory Visit: Payer: Self-pay | Admitting: Nurse Practitioner

## 2019-07-21 ENCOUNTER — Ambulatory Visit: Payer: Managed Care, Other (non HMO) | Admitting: Nurse Practitioner

## 2019-08-04 ENCOUNTER — Ambulatory Visit: Payer: BC Managed Care – PPO | Admitting: Nurse Practitioner

## 2019-08-31 ENCOUNTER — Ambulatory Visit: Payer: Managed Care, Other (non HMO) | Admitting: Nurse Practitioner

## 2019-11-02 NOTE — Progress Notes (Signed)
CARDIOLOGY OFFICE NOTE  Date:  11/09/2019    [name] Date of Birth: 1965/10/10 Medical Record #[id number]  PCP:  Cari Caraway, MD  Cardiologist:  Servando Snare    Chief Complaint  Patient presents with  . Follow-up    History of Present Illness: Elaine [name] is a 54 y.o. female who presents today for a follow up visit. Former patient of Dr. Sherryl Barters. She primarilyfollows with me.  She has a history of PAF -seen in [facility] back in November 2014 with paroxysmal atrial fibrillation. She converted on IV diltiazem. Her echocardiogram at that time was normal. CHADSVASCscore was 2 for hypertension and female sex. She was started on warfarin and then switched to [facility]. Other issues include past anemia, obesity,HLD and HTN. Negative OSA by sleep study noted.   I have followed her since - she has done ok - some breakthru AF. She has struggled with CV risk factor modification. Last seen in February - she was doing ok but some persistent elevated HR's with exercise. We got a GXT - this was reassuring. She has also had calcium scoring that was 0 earlier this year as well.   Comes in today. Here alone. Doing ok. Notes that last Saturday - little dizzy and weak in the legs - BP 88 systolic. She sat for a bit - it slowly went up to 114. She is doing well - starting to eat better - trying to do less of the "bad carbs" and more protein. She is now walking more since the weather has improved. No chest pain. Breathing is good. Her rhythm is good - she avoids alcohol, caffeine and knows her triggers.   Past Medical History:  Diagnosis Date  . Anemia   . Arthritis    hands, ankles, knees  . Atrial fibrillation ([place])    a. Formal dx 12/10/12 - suspected paroxysmal.  . Dyspnea    occasion with exertion   . GERD (gastroesophageal reflux disease)    diet control  . Goiter    labs within normal - MD just watching  . HTN (hypertension)    a. Formally dx 11/2012 - mild.  .  Hypercholesterolemia    Borderline - not requiring medicines  . Seasonal allergies   . Snoring 12/03/2014  . SVD (spontaneous vaginal delivery)    x 2    Past Surgical History:  Procedure Laterality Date  . AUGMENTATION MAMMAPLASTY Bilateral   . BREAST SURGERY    . COLONOSCOPY     polyps  . DILATATION & CURETTAGE/HYSTEROSCOPY WITH MYOSURE N/A 11/13/2017   Procedure: DILATATION & CURETTAGE/HYSTEROSCOPY WITH MYOSURE;  Surgeon: Thurnell Lose, MD;  Location: [facility];  Service: Gynecology;  Laterality: N/A;  . egg donation     In her 58's.  . WISDOM TOOTH EXTRACTION       Medications: Current Meds  Medication Sig  . acetaminophen (TYLENOL) 500 MG tablet Take 1,000 mg by mouth every 6 (six) hours as needed for mild pain.   Marland Kitchen apixaban (ELIQUIS) 5 MG TABS tablet Take 1 tablet (5 mg total) by mouth 2 (two) times daily.  . carboxymethylcellulose (REFRESH PLUS) 0.5 % SOLN Place 2 drops into both eyes 3 (three) times daily as needed (dry eyes).   . cholecalciferol (VITAMIN D) 1000 UNITS tablet Take 1,000 Units by mouth daily.  . cyanocobalamin 100 MCG tablet Take 100 mcg by mouth daily.  Marland Kitchen diltiazem (CARDIZEM CD) 240 MG 24 hr capsule TAKE 1 CAPSULE BY  MOUTH EVERY DAY  . docusate sodium (COLACE) 100 MG capsule Take 100 mg by mouth every other day.  Marland Kitchen MAGNESIUM PO Take 2 tablets by mouth every evening.  . Misc Natural Products (TART CHERRY ADVANCED PO) Take 1 capsule by mouth daily.  . Multiple Vitamins-Minerals (ADULT GUMMY PO) Take 2 tablets by mouth daily.  . Probiotic Product (PROBIOTIC PO) Take 1 capsule by mouth every evening.     Allergies: Allergies  Allergen Reactions  . Other Other (See Comments)    Raw Almonds, itching   Peaches, itching  . Ampicillin Rash    Has taken amoxicillin without any reaction    Social History: The patient  reports that she has never smoked. She has never used smokeless tobacco. She reports current alcohol use of about 1.0 standard drink of  alcohol per week. She reports that she does not use drugs.   Family History: The patient's family history includes Aneurysm in her mother; Atrial fibrillation in her father and another family member; Breast cancer in her cousin and maternal grandmother; CAD in her paternal uncle; Migraines in her sister; Peripheral vascular disease in her mother; Stroke in her mother.   Review of Systems: Please see the history of present illness.   All other systems are reviewed and negative.   Physical Exam: VS:  BP 122/84   Pulse 82   Ht 5\' 4"  (1.626 m)   Wt 208 lb 6.4 oz (94.5 kg)   LMP  (LMP Unknown)   SpO2 97%   BMI 35.77 kg/m  .  BMI Body mass index is 35.77 kg/m.  Wt Readings from Last 3 Encounters:  11/09/19 208 lb 6.4 oz (94.5 kg)  03/18/19 206 lb (93.4 kg)  10/28/18 209 lb (94.8 kg)    General: Alert and in no acute distress.   Cardiac: Regular rate and rhythm. No murmurs, rubs, or gallops. No edema.  Respiratory:  Lungs are clear to auscultation bilaterally with normal work of breathing.  GI: Soft and nontender.  MS: No deformity or atrophy. Gait and ROM intact.  Skin: Warm and dry. Color is normal.  Neuro:  Strength and sensation are intact and no gross focal deficits noted.  Psych: Alert, appropriate and with normal affect.   LABORATORY DATA:  EKG:  EKG is not ordered today.    Lab Results  Component Value Date   WBC 9.5 03/18/2019   HGB 13.9 03/18/2019   HCT 40.6 03/18/2019   PLT 404 03/18/2019   GLUCOSE 116 (H) 03/18/2019   CHOL 247 (H) 03/18/2019   TRIG 151 (H) 03/18/2019   HDL 69 03/18/2019   LDLCALC 151 (H) 03/18/2019   ALT 13 03/18/2019   AST 16 03/18/2019   NA 138 03/18/2019   K 4.6 03/18/2019   CL 102 03/18/2019   CREATININE 1.05 (H) 03/18/2019   BUN 13 03/18/2019   CO2 20 03/18/2019   TSH 1.110 03/18/2019   INR 1.09 11/06/2017     BNP (last 3 results) No results for input(s): BNP in the last 8760 hours.  ProBNP (last 3 results) No results for  input(s): PROBNP in the last 8760 hours.   Other Studies Reviewed Today:  CT CARDIAC SCORING IMPRESSION 03/2019: Coronary calcium score of 0.  Eleonore Chiquito, MD   Electronically Signed   By: Eleonore Chiquito  On: 04/14/2019 17:16   GXT Study Highlights March 2021   Blood pressure demonstrated a normal response to exercise.  There was no ST segment deviation noted  during stress.  Rare PAC noted during exercise.  Exercise time 6 minutes-overall fair. Workload 7 METS  Overall low risk exercise treadmill test with no electrocardiographic evidence of ischemia.  No atrial fibrillation identified.   Candee Furbish, MD  Echo Study Conclusions from 2014  Left ventricle: The cavity size was normal. Wall thickness was normal. Systolic function was normal. The estimated ejection fraction was in the range of 60% to 65%. Wall motion was normal; there were no regional wall motion abnormalities.     Assessment/Plan:  1. PAF - remains in sinus - on anticoagulation with Eliquis - needs labs today - would continue with current therapy.   2. Calcium score of 0 - she is trying to work on lifestyle/exercise, etc. Encouragement given.   3. Chronic anticoagulation - no problems noted - lab today. Would continue with current regimen.   4. HTN - BP is great - has gotten a little low - this correlates with her change in diet with less salt - she will continue to monitor. Her current dose of CCB is quite effective for her rhythm and this will be continued.   5. Obesity - she is focusing on lifestyle - she is back exercising.   Current medicines are reviewed with the patient today.  The patient does not have concerns regarding medicines other than what has been noted above.  The following changes have been made:  See above.  Labs/ tests ordered today include:    Orders Placed This Encounter  Procedures  . Basic metabolic panel  . CBC  . Hepatic function panel  . Lipid panel      Disposition:   Will get her to see Dr. Johney Frame in 6 months. She is aware that I am leaving in February. No changes made with her current regimen. Labs today.    Patient is agreeable to this plan and will call if any problems develop in the interim.   SignedTruitt Merle, NP  11/09/2019 4:16 PM  [facility] [address] [place], [place]  [address] Phone: [phone] Fax: [phone]

## 2019-11-09 ENCOUNTER — Other Ambulatory Visit: Payer: Self-pay

## 2019-11-09 ENCOUNTER — Encounter: Payer: Self-pay | Admitting: Nurse Practitioner

## 2019-11-09 ENCOUNTER — Ambulatory Visit (INDEPENDENT_AMBULATORY_CARE_PROVIDER_SITE_OTHER): Payer: Managed Care, Other (non HMO) | Admitting: Nurse Practitioner

## 2019-11-09 VITALS — BP 122/84 | HR 82 | Ht 64.0 in | Wt 208.4 lb

## 2019-11-09 DIAGNOSIS — I119 Hypertensive heart disease without heart failure: Secondary | ICD-10-CM | POA: Diagnosis not present

## 2019-11-09 DIAGNOSIS — E785 Hyperlipidemia, unspecified: Secondary | ICD-10-CM

## 2019-11-09 DIAGNOSIS — I480 Paroxysmal atrial fibrillation: Secondary | ICD-10-CM | POA: Diagnosis not present

## 2019-11-09 DIAGNOSIS — Z7901 Long term (current) use of anticoagulants: Secondary | ICD-10-CM

## 2019-11-09 NOTE — Patient Instructions (Addendum)
After Visit Summary:  We will be checking the following labs today - BMET, CBC, HPF and Lipids  Medication Instructions:    Continue with your current medicines.    If you need a refill on your cardiac medications before your next appointment, please call your pharmacy.     Testing/Procedures To Be Arranged:  N/A  Follow-Up:   See Dr. Gwyndolyn Kaufman in 6 months (new patient)    At [facility], you and your health needs are our priority.  As part of our continuing mission to provide you with exceptional heart care, we have created designated Provider Care Teams.  These Care Teams include your primary Cardiologist (physician) and Advanced Practice Providers (APPs -  Physician Assistants and Nurse Practitioners) who all work together to provide you with the care you need, when you need it.  Special Instructions:  . Stay safe, wash your hands for at least 20 seconds and wear a mask when needed.  . It was good to talk with you today.  . Use some salt if needed if BP is low.    Call the [place] office at [phone] if you have any questions, problems or concerns.

## 2019-11-10 LAB — BASIC METABOLIC PANEL
BUN/Creatinine Ratio: 16 (ref 9–23)
BUN: 20 mg/dL (ref 6–24)
CO2: 22 mmol/L (ref 20–29)
Calcium: 9.4 mg/dL (ref 8.7–10.2)
Chloride: 102 mmol/L (ref 96–106)
Creatinine, Ser: 1.25 mg/dL — ABNORMAL HIGH (ref 0.57–1.00)
GFR calc Af Amer: 56 mL/min/{1.73_m2} — ABNORMAL LOW (ref >59)
GFR calc non Af Amer: 49 mL/min/{1.73_m2} — ABNORMAL LOW (ref >59)
Glucose: 86 mg/dL (ref 65–99)
Potassium: 4.6 mmol/L (ref 3.5–5.2)
Sodium: 141 mmol/L (ref 134–144)

## 2019-11-10 LAB — CBC
Hematocrit: 40.2 % (ref 34.0–46.6)
Hemoglobin: 13.2 g/dL (ref 11.1–15.9)
MCH: 29.6 pg (ref 26.6–33.0)
MCHC: 32.8 g/dL (ref 31.5–35.7)
MCV: 90 fL (ref 79–97)
Platelets: 405 10*3/uL (ref 150–450)
RBC: 4.46 x10E6/uL (ref 3.77–5.28)
RDW: 12.6 % (ref 11.7–15.4)
WBC: 10.4 10*3/uL (ref 3.4–10.8)

## 2019-11-10 LAB — HEPATIC FUNCTION PANEL
ALT: 15 IU/L (ref 0–32)
AST: 15 IU/L (ref 0–40)
Albumin: 4.5 g/dL (ref 3.8–4.9)
Alkaline Phosphatase: 93 IU/L (ref 44–121)
Bilirubin Total: <0.2 mg/dL (ref 0.0–1.2)
Bilirubin, Direct: <0.1 mg/dL (ref 0.00–0.40)
Total Protein: 7.1 g/dL (ref 6.0–8.5)

## 2019-11-10 LAB — LIPID PANEL
Chol/HDL Ratio: 3.9 ratio (ref 0.0–4.4)
Cholesterol, Total: 236 mg/dL — ABNORMAL HIGH (ref 100–199)
HDL: 60 mg/dL (ref >39)
LDL Chol Calc (NIH): 149 mg/dL — ABNORMAL HIGH (ref 0–99)
Triglycerides: 153 mg/dL — ABNORMAL HIGH (ref 0–149)
VLDL Cholesterol Cal: 27 mg/dL (ref 5–40)

## 2019-11-12 ENCOUNTER — Other Ambulatory Visit: Payer: Self-pay | Admitting: Obstetrics and Gynecology

## 2019-11-12 DIAGNOSIS — Z1231 Encounter for screening mammogram for malignant neoplasm of breast: Secondary | ICD-10-CM

## 2019-12-16 ENCOUNTER — Ambulatory Visit
Admission: RE | Admit: 2019-12-16 | Discharge: 2019-12-16 | Disposition: A | Discharge status: Home / Self Care | Payer: Managed Care, Other (non HMO) | Source: Ambulatory Visit | Attending: Obstetrics and Gynecology | Admitting: Obstetrics and Gynecology

## 2019-12-16 ENCOUNTER — Other Ambulatory Visit: Payer: Self-pay

## 2019-12-16 DIAGNOSIS — Z1231 Encounter for screening mammogram for malignant neoplasm of breast: Secondary | ICD-10-CM

## 2020-04-20 ENCOUNTER — Telehealth: Payer: Self-pay | Admitting: *Deleted

## 2020-04-20 NOTE — Telephone Encounter (Signed)
Patient with diagnosis of atrial fibrillation on Eliquis for anticoagulation.    Procedure: endoscopy and colonoscopy Date of procedure: 06/24/20   CHA2DS2-VASc Score = 2  This indicates a 2.2% annual risk of stroke. The patient's score is based upon: CHF History: No HTN History: Yes Diabetes History: No Stroke History: No Vascular Disease History: No Age Score: 0 Gender Score: 1     CrCl 75.9 Platelet count 405  Per office protocol, patient can hold Eliquis for 2 days prior to procedure.    Patient will not need bridging with Lovenox (enoxaparin) around procedure.

## 2020-04-20 NOTE — Telephone Encounter (Signed)
   [facility] Pre-operative Risk Assessment    HEARTCARE STAFF: - Please ensure there is not already an duplicate clearance open for this procedure. - Under Visit Info/Reason for Call, type in Other and utilize the format Clearance MM/DD/YY or Clearance TBD. Do not use dashes or single digits. - If request is for dental extraction, please clarify the # of teeth to be extracted.  Request for surgical clearance:  1. What type of surgery is being performed? COLONOSCOPY AND ENDOSCOPY   2. When is this surgery scheduled? 06/24/20   3. What type of clearance is required (medical clearance vs. Pharmacy clearance to hold med vs. Both)? BOTH  4. Are there any medications that need to be held prior to surgery and how long? ELIQUIS   5. Practice name and name of physician performing surgery? [facility]; DR. Michail Sermon   6. What is the office phone number? [phone]   7.   What is the office fax number? [phone]  8.   Anesthesia type (None, local, MAC, general) ? PROPOFOL   Julaine Hua 04/20/2020, 2:03 PM  _________________________________________________________________   (provider comments below)

## 2020-04-21 NOTE — Telephone Encounter (Signed)
Elaine [name] was last seen in clinic on 11/09/2019.  During that time she was doing well.  She did note an episode of dizziness with a systolic BP of 88.  She reported she sat down to rest and it slowly came up to 114.  She reported she was monitoring her diet more closely and exercising more frequently.  She denied chest pain and shortness of breath.  She was avoiding caffeine, alcohol, and triggers for atrial fibrillation.  She has colonoscopy scheduled for 06/24/2020.  She has appointment scheduled with you on 05/09/2020 at 9:20 AM.  I will defer cardiac clearance to you at that time and have the schedulers add a cardiac clearance notification to her scheduled comments.   Thank you for your help.  Jossie Ng. Juel Ripley NP-C    04/21/2020, [place] [address] Office [phone] Fax [phone]

## 2020-04-21 NOTE — Telephone Encounter (Signed)
Will forward clearance notes to MD for upcoming appt 05/09/20. Will send FYI to requesting office pt has appt 05/09/20.

## 2020-05-09 ENCOUNTER — Ambulatory Visit: Payer: Managed Care, Other (non HMO) | Admitting: Cardiology

## 2020-06-13 IMAGING — CT CT CARDIAC CORONARY ARTERY CALCIUM SCORE
3 series · 14 of 20 positions shown, 15 images · non-contrast
Comparison: None.
COMPARISON: None.

Addendum:
EXAM:
OVER-READ INTERPRETATION  CT CHEST

The following report is an over-read performed by radiologist Dr.
Guled Caster [REDACTED] on 04/14/2019. This
over-read does not include interpretation of cardiac or coronary
anatomy or pathology. The coronary calcium score interpretation by
the cardiologist is attached.
CLINICAL DATA: Risk stratification
Coronary Calcium Score
TECHNIQUE: The patient was scanned on a Siemens Force scanner. Axial
non-contrast 3 mm slices were carried out through the heart. The
data set was analyzed on a dedicated work station and scored using
the Agatson method.

[Series 2: casc 3.0 bv41 2 bestdiast 72 % · axial · 0.34mm/px · z∈[-264,-180]mm · 4 of 48 slices shown, 5 images]
[im 10/48  vessel]
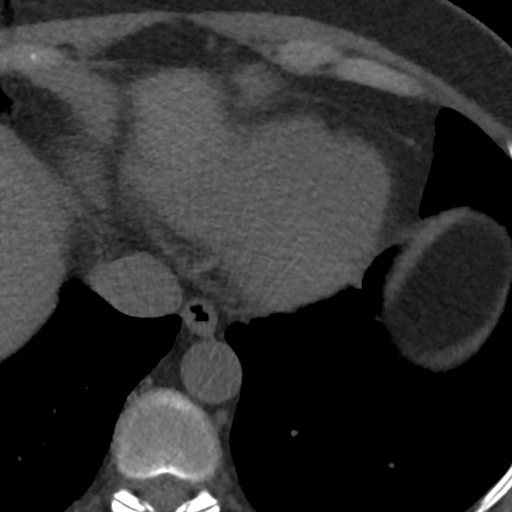
[im 10/48  lung]
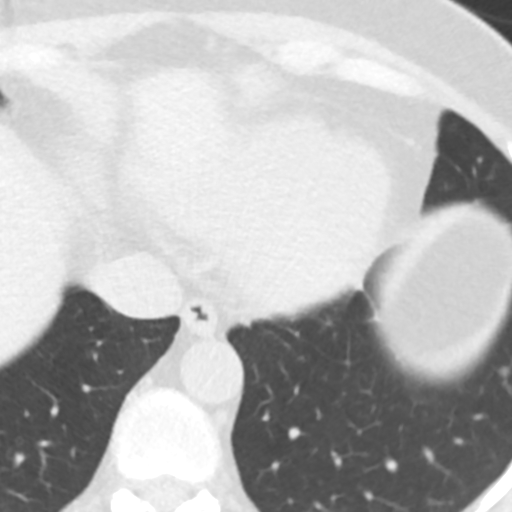
[im 19/48  vessel]
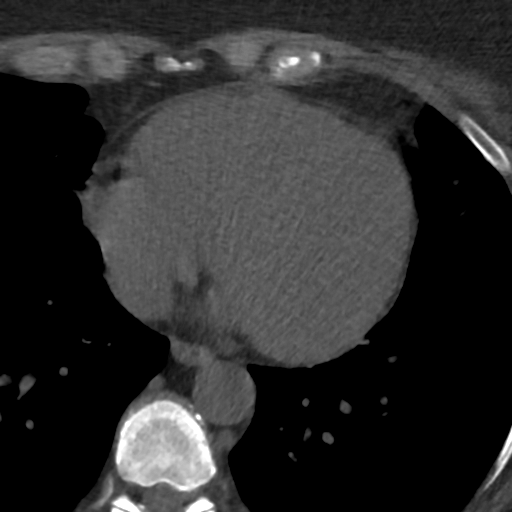
[im 29/48  vessel]
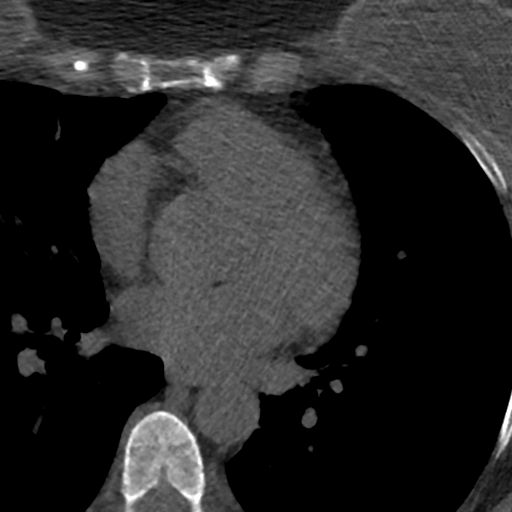
[im 38/48  vessel]
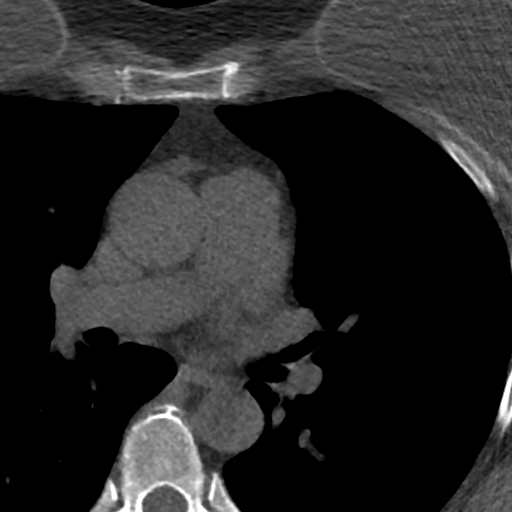

[Series 3: lung 73 % · axial · 0.68mm/px · z∈[-270,-174]mm · 5 of 48 slices shown]
[im 8/48  lung]
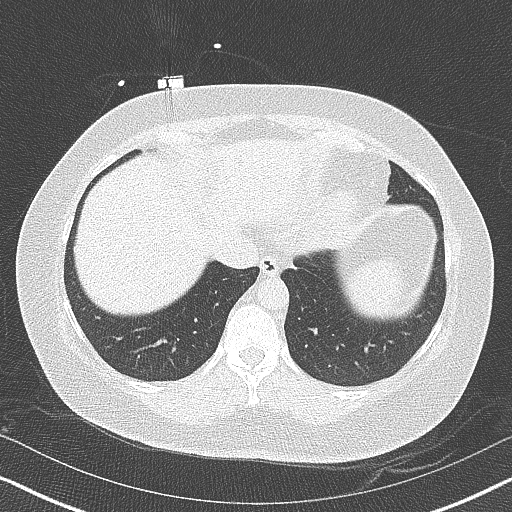
[im 16/48  lung]
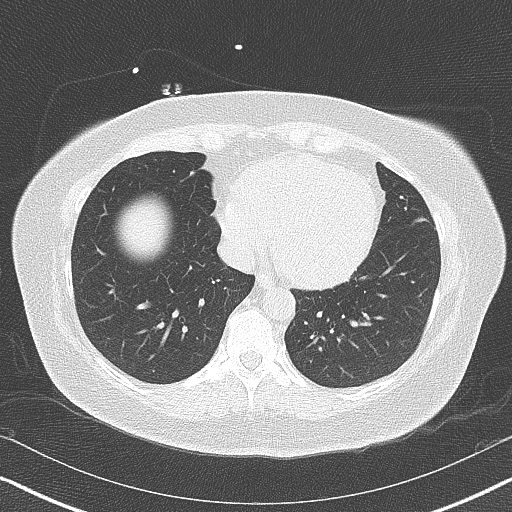
[im 24/48  lung]
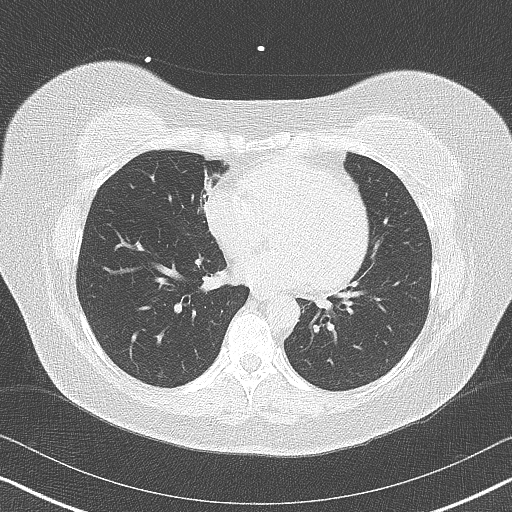
[im 32/48  lung]
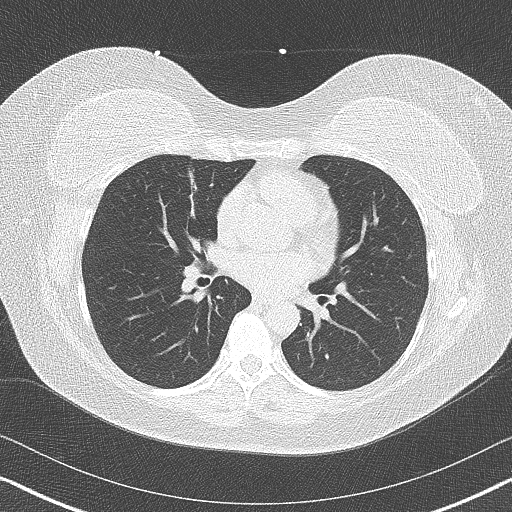
[im 40/48  lung]
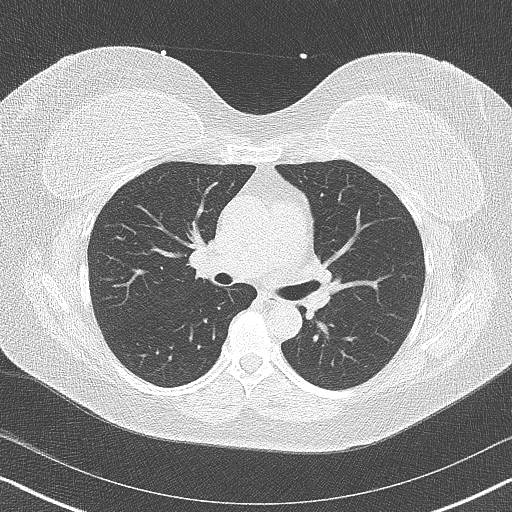

[Series 4: lung st 73 % · axial · 0.68mm/px · z∈[-270,-174]mm · 5 of 48 slices shown]
[im 8/48  lung]
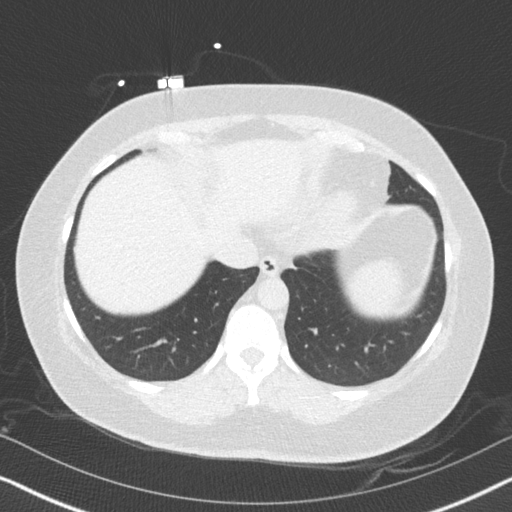
[im 16/48  lung]
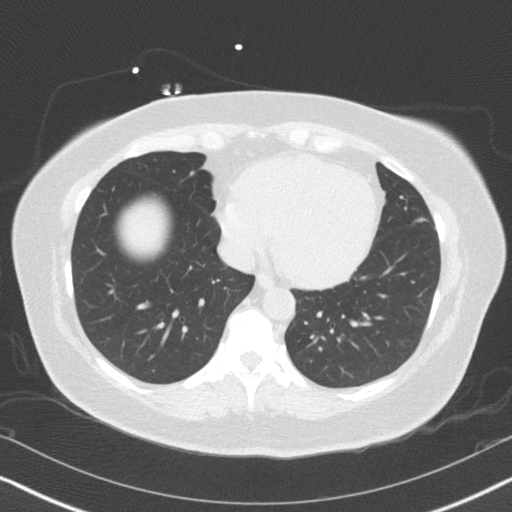
[im 24/48  lung]
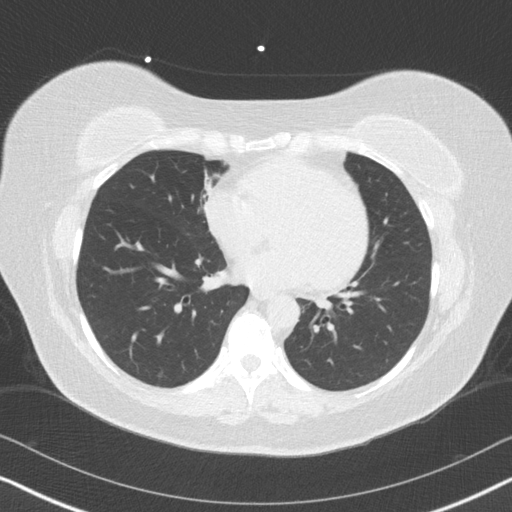
[im 32/48  lung]
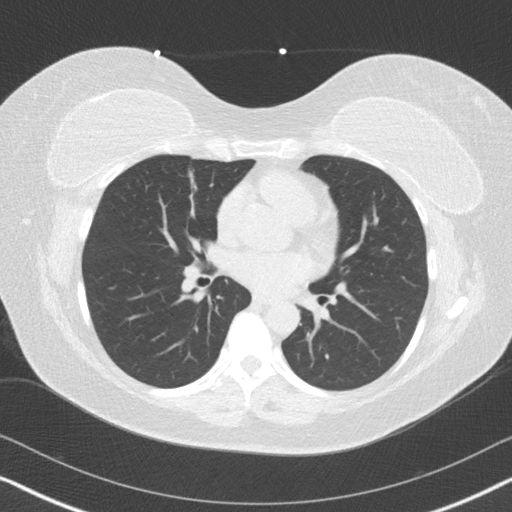
[im 40/48  lung]
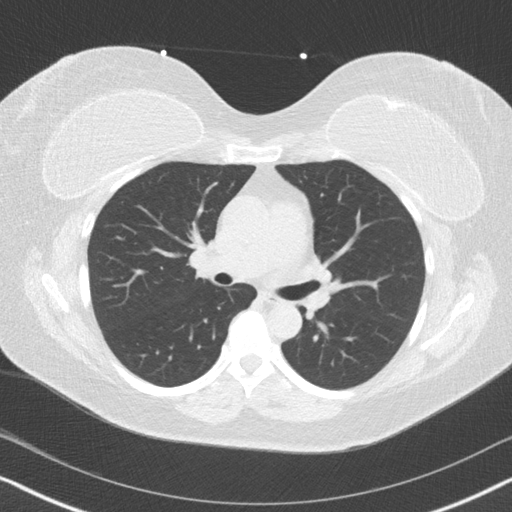

[14 of 20 positions shown; findings below may reference images not displayed]

FINDINGS: Mild linear scarring in the medial segment of the right middle lobe.
Within the visualized portions of the thorax there are no suspicious
appearing pulmonary nodules or masses, there is no acute
consolidative airspace disease, no pleural effusions, no
pneumothorax and no lymphadenopathy. Visualized portions of the
upper abdomen are unremarkable. There are no aggressive appearing
lytic or blastic lesions noted in the visualized portions of the
skeleton. Bilateral breast implants are incidentally noted.
IMPRESSION: No significant incidental noncardiac findings are noted.
FINDINGS: Non-cardiac: See separate report from [REDACTED].

Ascending Aorta: Normal caliber.

Pericardium: Normal.

Coronary arteries: Normal origins.
IMPRESSION: Coronary calcium score of 0.

*** End of Addendum ***
EXAM:
OVER-READ INTERPRETATION  CT CHEST

The following report is an over-read performed by radiologist Dr.
Guled Caster [REDACTED] on 04/14/2019. This
over-read does not include interpretation of cardiac or coronary
anatomy or pathology. The coronary calcium score interpretation by
the cardiologist is attached.
FINDINGS: Mild linear scarring in the medial segment of the right middle lobe.
Within the visualized portions of the thorax there are no suspicious
appearing pulmonary nodules or masses, there is no acute
consolidative airspace disease, no pleural effusions, no
pneumothorax and no lymphadenopathy. Visualized portions of the
upper abdomen are unremarkable. There are no aggressive appearing
lytic or blastic lesions noted in the visualized portions of the
skeleton. Bilateral breast implants are incidentally noted.
IMPRESSION: No significant incidental noncardiac findings are noted.

## 2020-06-17 ENCOUNTER — Other Ambulatory Visit: Payer: Self-pay

## 2020-06-17 MED ORDER — ELIQUIS 5 MG PO TABS: 5.0000 mg | ORAL_TABLET | Freq: Two times a day (BID) | ORAL | 1 refills | Status: DC

## 2020-06-17 NOTE — Telephone Encounter (Signed)
Pt last saw Truitt Merle, NP on 11/09/19, last labs 11/09/19 Creat 1.25, age 55, weight 94.5kg, based on specified criteria pt is on appropriate dosage of Eliquis 5mg  BID for afib.  Will refill rx.

## 2020-08-05 ENCOUNTER — Ambulatory Visit: Payer: Self-pay | Admitting: Cardiology

## 2020-09-28 ENCOUNTER — Other Ambulatory Visit: Payer: Self-pay | Admitting: *Deleted

## 2020-09-28 MED ORDER — DILTIAZEM HCL ER COATED BEADS 240 MG PO CP24: 240.0000 mg | ORAL_CAPSULE | Freq: Every day | ORAL | 0 refills | Status: DC

## 2020-10-06 NOTE — [place] Notes (Addendum)
Cardiology Office Note:    Date:  10/07/2020   ID:  [name], DOB [birth year], MRN [id number]  PCP:  Cari Caraway, MD   [facility] Providers Cardiologist:  Freada Bergeron, MD Cardiology APP:  Sharmon Revere      Referring MD: Cari Caraway, MD   Chief Complaint:  F/u for AFib    Patient Profile:    Elaine [name] is a 55 y.o. female with:  Paroxysmal atrial fibrillation  Anemia Obesity  Hypertension  Hyperlipidemia  Sleep study neg in past CT 2021: CAC score 0  Prior CV studies: EXERCISE TOLERANCE TEST (ETT) 04/14/2019  Blood pressure demonstrated a normal response to exercise.  There was no ST segment deviation noted during stress.  Rare PAC noted during exercise.  Exercise time 6 minutes-overall fair. Workload 7 METS  Overall low risk exercise treadmill test with no electrocardiographic evidence of ischemia.  No atrial fibrillation identified.  CAC Score 04/14/19 IMPRESSION: Coronary calcium score of 0.  Echocardiogram 12/10/12 EF 60-65, no RWMA   History of Present Illness: Elaine [name] has been followed primarily by Truitt Merle, NP and was last seen in 10/2019.  She is to est with Dr. Johney Frame.  She returns for Cardiology f/u.  She is here alone.  Overall, she has been doing well.  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  She is traveling more and has been more active.  She notes less issues with dyspnea with exertion.  She has occasional palpitations but no symptoms like she had with atrial fibrillation with rapid ventricular rate years ago.  She has not had chest discomfort, syncope, orthopnea.  She does not have a significant history of snoring.  Prior sleep study was negative.        Past Medical History:  Diagnosis Date   Anemia    Arthritis    hands, ankles, knees   Atrial fibrillation ([place])    a. Formal dx 12/10/12 - suspected paroxysmal.   Dyspnea    occasion with exertion    GERD (gastroesophageal reflux disease)    diet control    Goiter    labs within normal - MD just watching   HTN (hypertension)    a. Formally dx 11/2012 - mild.   Hypercholesterolemia    Borderline - not requiring medicines   Seasonal allergies    Snoring 12/03/2014   SVD (spontaneous vaginal delivery)    x 2   Current Medications: Current Meds  Medication Sig   acetaminophen (TYLENOL) 500 MG tablet Take 1,000 mg by mouth every 6 (six) hours as needed for mild pain.    apixaban (ELIQUIS) 5 MG TABS tablet Take 1 tablet (5 mg total) by mouth 2 (two) times daily.   carboxymethylcellulose (REFRESH PLUS) 0.5 % SOLN Place 2 drops into both eyes 3 (three) times daily as needed (dry eyes).    cholecalciferol (VITAMIN D) 1000 UNITS tablet Take 1,000 Units by mouth daily.   diltiazem (CARDIZEM CD) 240 MG 24 hr capsule Take 1 capsule (240 mg total) by mouth daily.   docusate sodium (COLACE) 100 MG capsule Take 100 mg by mouth every other day.   MAGNESIUM PO Take 2 tablets by mouth every evening.   Multiple Vitamins-Minerals (ADULT GUMMY PO) Take 2 tablets by mouth daily.   Tart Cherry 1200 MG CAPS Take by mouth.   Wheat Dextrin (BENEFIBER DRINK MIX PO)     Allergies:   Other and Ampicillin   Social History   Tobacco  Use   Smoking status: Never   Smokeless tobacco: Never   Tobacco comments:    Secondhand smoke up to age 69  Vaping Use   Vaping Use: Never used  Substance Use Topics   Alcohol use: Yes    Alcohol/week: 1.0 standard drink    Types: 1 Glasses of wine per week   Drug use: No    Family Hx: The patient's family history includes Aneurysm in her mother; Atrial fibrillation in her father and another family member; Breast cancer in her cousin and maternal grandmother; CAD in her paternal uncle; Migraines in her sister; Peripheral vascular disease in her mother; Stroke in her mother.  Review of Systems  Gastrointestinal:  Negative for hematochezia and melena.  Genitourinary:  Negative for hematuria.    EKGs/Labs/Other Test  Reviewed:    EKG:  EKG is  ordered today.  The ekg ordered today demonstrates normal sinus rhythm, heart rate 66, leftward axis, no ST-T wave changes, QTC 387  Recent Labs: 11/09/2019: ALT 15; BUN 20; Creatinine, Ser 1.25; Hemoglobin 13.2; Platelets 405; Potassium 4.6; Sodium 141   Recent Lipid Panel Lab Results  Component Value Date/Time   CHOL 236 (H) 11/09/2019 04:19 PM   TRIG 153 (H) 11/09/2019 04:19 PM   HDL 60 11/09/2019 04:19 PM   LDLCALC 149 (H) 11/09/2019 04:19 PM     Risk Assessment/Calculations:    CHA2DS2-VASc Score = 2   This indicates a 2.2% annual risk of stroke. The patient's score is based upon: CHF History: 0 HTN History: 1 Diabetes History: 0 Stroke History: 0 Vascular Disease History: 0 Age Score: 0 Gender Score: 1          Physical Exam:    VS:  BP 138/78 (BP Location: Right Arm, Patient Position: Sitting, Cuff Size: Large)   Pulse 66   Ht '5\' 4"'  (1.626 m)   Wt 205 lb (93 kg)   LMP  (LMP Unknown)   SpO2 98%   BMI 35.19 kg/m     Wt Readings from Last 3 Encounters:  10/07/20 205 lb (93 kg)  11/09/19 208 lb 6.4 oz (94.5 kg)  03/18/19 206 lb (93.4 kg)    Constitutional:      Appearance: Healthy appearance. Not in distress.  Neck:     Vascular: No carotid bruit. JVD normal.  Pulmonary:     Effort: Pulmonary effort is normal.     Breath sounds: No wheezing. No rales.  Cardiovascular:     Normal rate. Regular rhythm. Normal S1. Normal S2.      Murmurs: There is no murmur.  Edema:    Peripheral edema absent.  Abdominal:     Palpations: Abdomen is soft. There is no hepatomegaly.  Skin:    General: Skin is warm and dry.  Neurological:     Mental Status: Alert and oriented to person, place and time.     Cranial Nerves: Cranial nerves are intact.         ASSESSMENT & PLAN:    1. PAF (paroxysmal atrial fibrillation) ([place]) 2. Secondary hypercoagulable state ([place]) Maintaining sinus rhythm.  She has been maintained on anticoagulation for  years and has tolerated it.  Therefore, I recommend continuing apixaban 5 mg twice daily.  Obtain follow-up CMET, CBC today.  3. Essential hypertension Borderline elevation today in the office.  She checks her pressures at home and are typically 939 or less, systolic.  Overall, her blood pressure is well controlled.  Continue diltiazem 240 mg daily.  4. Other hyperlipidemia ASCVD 10-year risk is 3.4%.  Calcium score last year was 0.  Therefore, she does not require medical management at this time.  Obtain follow-up CMET, lipid panel.  5. Goiter Obtain follow-up TSH.            Dispo:  Return in about 1 year (around 10/07/2021) for Routine Follow Up, w/ Dr. Johney Frame, or Richardson Dopp, PA-C.   Medication Adjustments/Labs and Tests Ordered: Current medicines are reviewed at length with the patient today.  Concerns regarding medicines are outlined above.  Tests Ordered: Orders Placed This Encounter  Procedures   Comp Met (CMET)   Lipid Profile   CBC   TSH   EKG 12-Lead    Medication Changes: No orders of the defined types were placed in this encounter.  Signed, Richardson Dopp, PA-C  10/07/2020 8:56 AM    [facility] [place], [place], [place]  [address] Phone: [phone]; Fax: [phone]

## 2020-10-07 ENCOUNTER — Ambulatory Visit: Payer: BC Managed Care – PPO | Admitting: Physician Assistant

## 2020-10-07 ENCOUNTER — Other Ambulatory Visit: Payer: Self-pay

## 2020-10-07 ENCOUNTER — Encounter: Payer: Self-pay | Admitting: Physician Assistant

## 2020-10-07 VITALS — BP 138/78 | HR 66 | Ht 64.0 in | Wt 205.0 lb

## 2020-10-07 DIAGNOSIS — E049 Nontoxic goiter, unspecified: Secondary | ICD-10-CM

## 2020-10-07 DIAGNOSIS — I10 Essential (primary) hypertension: Secondary | ICD-10-CM | POA: Diagnosis not present

## 2020-10-07 DIAGNOSIS — D6869 Other thrombophilia: Secondary | ICD-10-CM | POA: Diagnosis not present

## 2020-10-07 DIAGNOSIS — I480 Paroxysmal atrial fibrillation: Secondary | ICD-10-CM | POA: Diagnosis not present

## 2020-10-07 DIAGNOSIS — E7849 Other hyperlipidemia: Secondary | ICD-10-CM | POA: Diagnosis not present

## 2020-10-07 LAB — COMPREHENSIVE METABOLIC PANEL
ALT: 14 IU/L (ref 0–32)
AST: 14 IU/L (ref 0–40)
Albumin/Globulin Ratio: 1.8 (ref 1.2–2.2)
Albumin: 4.6 g/dL (ref 3.8–4.9)
Alkaline Phosphatase: 88 IU/L (ref 44–121)
BUN/Creatinine Ratio: 17 (ref 9–23)
BUN: 18 mg/dL (ref 6–24)
Bilirubin Total: 0.3 mg/dL (ref 0.0–1.2)
CO2: 24 mmol/L (ref 20–29)
Calcium: 9.7 mg/dL (ref 8.7–10.2)
Chloride: 99 mmol/L (ref 96–106)
Creatinine, Ser: 1.09 mg/dL — ABNORMAL HIGH (ref 0.57–1.00)
Globulin, Total: 2.6 g/dL (ref 1.5–4.5)
Glucose: 83 mg/dL (ref 65–99)
Potassium: 5.1 mmol/L (ref 3.5–5.2)
Sodium: 140 mmol/L (ref 134–144)
Total Protein: 7.2 g/dL (ref 6.0–8.5)
eGFR: 60 mL/min/{1.73_m2} (ref >59)

## 2020-10-07 LAB — CBC
Hematocrit: 40.3 % (ref 34.0–46.6)
Hemoglobin: 13.6 g/dL (ref 11.1–15.9)
MCH: 30.6 pg (ref 26.6–33.0)
MCHC: 33.7 g/dL (ref 31.5–35.7)
MCV: 91 fL (ref 79–97)
Platelets: 388 10*3/uL (ref 150–450)
RBC: 4.44 x10E6/uL (ref 3.77–5.28)
RDW: 12.6 % (ref 11.7–15.4)
WBC: 7.9 10*3/uL (ref 3.4–10.8)

## 2020-10-07 LAB — LIPID PANEL
Chol/HDL Ratio: 3.6 ratio (ref 0.0–4.4)
Cholesterol, Total: 236 mg/dL — ABNORMAL HIGH (ref 100–199)
HDL: 66 mg/dL (ref >39)
LDL Chol Calc (NIH): 150 mg/dL — ABNORMAL HIGH (ref 0–99)
Triglycerides: 115 mg/dL (ref 0–149)
VLDL Cholesterol Cal: 20 mg/dL (ref 5–40)

## 2020-10-07 LAB — TSH: TSH: 1.21 u[IU]/mL (ref 0.450–4.500)

## 2020-10-07 NOTE — Patient Instructions (Signed)
Medication Instructions:  Your physician recommends that you continue on your current medications as directed. Please refer to the Current Medication list given to you today.  *If you need a refill on your cardiac medications before your next appointment, please call your pharmacy*   Lab Work: TODAY!!!!! CMET/LIPID/CBC/TSH  If you have labs (blood work) drawn today and your tests are completely normal, you will receive your results only by: [place] (if you have MyChart) OR A paper copy in the mail If you have any lab test that is abnormal or we need to change your treatment, we will call you to review the results.   Testing/Procedures:  -NONE  Follow-Up: At [facility], you and your health needs are our priority.  As part of our continuing mission to provide you with exceptional heart care, we have created designated Provider Care Teams.  These Care Teams include your primary Cardiologist (physician) and Advanced Practice Providers (APPs -  Physician Assistants and Nurse Practitioners) who all work together to provide you with the care you need, when you need it.  We recommend signing up for the patient portal called "MyChart".  Sign up information is provided on this After Visit Summary.  MyChart is used to connect with patients for Virtual Visits (Telemedicine).  Patients are able to view lab/test results, encounter notes, upcoming appointments, etc.  Non-urgent messages can be sent to your provider as well.   To learn more about what you can do with MyChart, go to [email or web address].    Your next appointment:   1 year(s)  The format for your next appointment:   In Person  Provider:   You may see Freada Bergeron, MD or one of the following Advanced Practice Providers on your designated Care Team:   Richardson Dopp, PA-C  Other Instructions Your physician wants you to follow-up in: 1 year with Dr. Johney Frame or Richardson Dopp, PA-C.  You will receive a reminder  letter in the mail two months in advance. If you don't receive a letter, please call our office to schedule the follow-up appointment.

## 2020-10-28 ENCOUNTER — Other Ambulatory Visit: Payer: Self-pay | Admitting: Obstetrics and Gynecology

## 2020-10-28 DIAGNOSIS — Z1231 Encounter for screening mammogram for malignant neoplasm of breast: Secondary | ICD-10-CM

## 2020-11-18 ENCOUNTER — Telehealth: Payer: Self-pay | Admitting: Cardiology

## 2020-11-18 ENCOUNTER — Other Ambulatory Visit: Payer: Self-pay | Admitting: *Deleted

## 2020-11-18 MED ORDER — DILTIAZEM HCL ER COATED BEADS 240 MG PO CP24: 240.0000 mg | ORAL_CAPSULE | Freq: Every day | ORAL | 11 refills | Status: DC

## 2020-11-18 MED ORDER — APIXABAN 5 MG PO TABS: 5.0000 mg | ORAL_TABLET | Freq: Two times a day (BID) | ORAL | 1 refills | Status: DC

## 2020-11-18 NOTE — Telephone Encounter (Signed)
*  STAT* If patient is at the pharmacy, call can be transferred to refill team.   1. Which medications need to be refilled? (please list name of each medication and dose if known) diltiazem (CARDIZEM CD) 240 MG 24 hr capsule  2. Which pharmacy/location (including street and city if local pharmacy) is medication to be sent to? [id number] [place], [place] - [address]  3. Do they need a 30 day or 90 day supply? 90 day   Patient is out of medication

## 2020-11-18 NOTE — Telephone Encounter (Signed)
Eliquis 5mg  refill request received. Patient is 55 years old, weight-93kg, Crea-1.09 on 10/07/2020, Diagnosis-Afib, and last seen by Richardson Dopp on 10/07/2020. Dose is appropriate based on dosing criteria. Will send in refill to requested pharmacy.

## 2020-12-19 ENCOUNTER — Ambulatory Visit
Admission: RE | Admit: 2020-12-19 | Discharge: 2020-12-19 | Disposition: A | Discharge status: Home / Self Care | Payer: BC Managed Care – PPO | Source: Ambulatory Visit | Attending: Obstetrics and Gynecology | Admitting: Obstetrics and Gynecology

## 2020-12-19 DIAGNOSIS — Z1231 Encounter for screening mammogram for malignant neoplasm of breast: Secondary | ICD-10-CM

## 2021-02-02 DIAGNOSIS — L821 Other seborrheic keratosis: Secondary | ICD-10-CM | POA: Diagnosis not present

## 2021-02-28 DIAGNOSIS — Z0000 Encounter for general adult medical examination without abnormal findings: Secondary | ICD-10-CM | POA: Diagnosis not present

## 2021-03-17 ENCOUNTER — Encounter: Payer: Self-pay | Admitting: Physician Assistant

## 2021-06-05 ENCOUNTER — Other Ambulatory Visit: Payer: Self-pay | Admitting: *Deleted

## 2021-06-05 DIAGNOSIS — I480 Paroxysmal atrial fibrillation: Secondary | ICD-10-CM

## 2021-06-05 MED ORDER — APIXABAN 5 MG PO TABS: 5.0000 mg | ORAL_TABLET | Freq: Two times a day (BID) | ORAL | 1 refills | Status: DC

## 2021-06-05 NOTE — Telephone Encounter (Signed)
Eliquis '5mg'$  paper refill request received. Patient is 56 years old, weight-93kg, Crea-1.09 on 10/07/2020, Diagnosis-Afib, and last seen by Richardson Dopp on 10/07/2020. Dose is appropriate based on dosing criteria. Will send in refill to requested pharmacy.   ?

## 2021-11-03 ENCOUNTER — Other Ambulatory Visit: Payer: Self-pay

## 2021-11-03 MED ORDER — DILTIAZEM HCL ER COATED BEADS 240 MG PO CP24: 240.0000 mg | ORAL_CAPSULE | Freq: Every day | ORAL | 0 refills | Status: DC

## 2021-11-27 ENCOUNTER — Other Ambulatory Visit: Payer: Self-pay

## 2021-11-27 ENCOUNTER — Other Ambulatory Visit: Payer: Self-pay | Admitting: *Deleted

## 2021-11-27 DIAGNOSIS — I480 Paroxysmal atrial fibrillation: Secondary | ICD-10-CM

## 2021-11-27 MED ORDER — APIXABAN 5 MG PO TABS: 5.0000 mg | ORAL_TABLET | Freq: Two times a day (BID) | ORAL | 0 refills | Status: DC

## 2021-11-27 MED ORDER — DILTIAZEM HCL ER COATED BEADS 240 MG PO CP24: 240.0000 mg | ORAL_CAPSULE | Freq: Every day | ORAL | 1 refills | Status: DC

## 2021-11-27 NOTE — Telephone Encounter (Signed)
Prescription refill request for Eliquis received. Indication: Afib  Last office visit: 10/07/20 Kathlen Mody)  Scr: 1.09 (10/07/20)  Age: 56 Weight: 93kg  Pt overdue for labs and office visit with cardiology. Pt has appt schedule for 12/29/21 with Kathlen Mody. Note placed on appt to have labs drawn at appt. Appropriate dose sent to requested pharmacy.

## 2021-12-05 ENCOUNTER — Ambulatory Visit: Payer: BC Managed Care – PPO | Admitting: Physician Assistant

## 2021-12-07 ENCOUNTER — Other Ambulatory Visit: Payer: Self-pay | Admitting: Obstetrics and Gynecology

## 2021-12-07 DIAGNOSIS — Z1231 Encounter for screening mammogram for malignant neoplasm of breast: Secondary | ICD-10-CM

## 2021-12-26 ENCOUNTER — Other Ambulatory Visit: Payer: Self-pay | Admitting: *Deleted

## 2021-12-26 DIAGNOSIS — Z1151 Encounter for screening for human papillomavirus (HPV): Secondary | ICD-10-CM | POA: Diagnosis not present

## 2021-12-26 DIAGNOSIS — Z124 Encounter for screening for malignant neoplasm of cervix: Secondary | ICD-10-CM | POA: Diagnosis not present

## 2021-12-26 DIAGNOSIS — Z01419 Encounter for gynecological examination (general) (routine) without abnormal findings: Secondary | ICD-10-CM | POA: Diagnosis not present

## 2021-12-26 MED ORDER — DILTIAZEM HCL ER COATED BEADS 240 MG PO CP24: 240.0000 mg | ORAL_CAPSULE | Freq: Every day | ORAL | 1 refills | Status: DC

## 2022-01-07 NOTE — Progress Notes (Unsigned)
Cardiology Office Note:    Date:  01/08/2022   ID:  Elaine [name], DOB Jun 18, 1965, MRN [id number]  PCP:  Cari Caraway, [place] [facility] Cardiologist:  Freada Bergeron, MD Cardiology APP:  Sharmon Revere     Referring MD: Cari Caraway, MD   No chief complaint on file.   History of Present Illness:    Elaine [name] is a 56 y.o. female with a hx of AF (Eliquis, CHADS-VASc 2) hypertensive heart disease, and HLD.   Last evaluated in the office on 10/07/20 for follow up af her AF.   She presents today for a routine follow up. She endorses that she has largely been doing well with the exception of contracting COVID in November. At that time, she had fever, chills, n/v/d, and noted that she went into AF per her apple watch rate 160 bpm. She contacted EMS, by the time EMS arrived, she was back in NSR with some PVCs. She elected to stay at home. The following day, she again went into AF ~ 1 hour and eventually converted back to NSR. Since then, she denies chest pain, palpitations, dyspnea, pnd, orthopnea, n, v, dizziness, syncope, edema, weight gain, or early satiety. She does have some SOB, when she walks briskly with her dog or when climbing > 2 flights of steps, but this is chronic for her. She checks her BP most days and reports that readings are typically 118-130's/70's. She also notes that her resting heart rate is usually in the 80's, when it had been in the 60-70's.   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 She does not currently have time to exercise, but she would like to get a regular workout routine established. She is intentionally trying to lose weight and has lost ~ 10 lbs.   Past Medical History:  Diagnosis Date   Anemia    Arthritis    hands, ankles, knees   Atrial fibrillation ([place])    a. Formal dx 12/10/12 - suspected paroxysmal.   Dyspnea    occasion with exertion    GERD (gastroesophageal reflux disease)    diet control    Goiter    labs within normal - MD just watching   HTN (hypertension)    a. Formally dx 11/2012 - mild.   Hypercholesterolemia    Borderline - not requiring medicines   Seasonal allergies    Snoring 12/03/2014   SVD (spontaneous vaginal delivery)    x 2    Past Surgical History:  Procedure Laterality Date   AUGMENTATION MAMMAPLASTY Bilateral    BREAST SURGERY     COLONOSCOPY     polyps   DILATATION & CURETTAGE/HYSTEROSCOPY WITH MYOSURE N/A 11/13/2017   Procedure: DILATATION & CURETTAGE/HYSTEROSCOPY WITH MYOSURE;  Surgeon: Thurnell Lose, MD;  Location: [facility];  Service: Gynecology;  Laterality: N/A;   egg donation     In her 67's.   WISDOM TOOTH EXTRACTION      Current Medications: Current Meds  Medication Sig   acetaminophen (TYLENOL) 500 MG tablet Take 1,000 mg by mouth every 6 (six) hours as needed for mild pain.    apixaban (ELIQUIS) 5 MG TABS tablet Take 1 tablet (5 mg total) by mouth 2 (two) times daily.   carboxymethylcellulose (REFRESH PLUS) 0.5 % SOLN Place 2 drops into both eyes 3 (three) times daily as needed (dry eyes).    cholecalciferol (VITAMIN D) 1000 UNITS tablet Take 1,000 Units by mouth daily.  diltiazem (CARDIZEM CD) 240 MG 24 hr capsule Take 1 capsule (240 mg total) by mouth daily.   docusate sodium (COLACE) 100 MG capsule Take 100 mg by mouth every other day.   MAGNESIUM PO Take 2 tablets by mouth every evening.   Multiple Vitamins-Minerals (ADULT GUMMY PO) Take 2 tablets by mouth daily.   Tart Cherry 1200 MG CAPS Take by mouth.   Wheat Dextrin (BENEFIBER DRINK MIX PO)      Allergies:   Other and Ampicillin   Social History   Socioeconomic History   Marital status: Married    Spouse name: Not on file   Number of children: Not on file   Years of education: Not on file   Highest education level: Not on file  Occupational History    Employer: [employer]    Comment: Nurse  Tobacco Use   Smoking status: Never   Smokeless  tobacco: Never   Tobacco comments:    Secondhand smoke up to age 51  Vaping Use   Vaping Use: Never used  Substance and Sexual Activity   Alcohol use: Yes    Alcohol/week: 1.0 standard drink of alcohol    Types: 1 Glasses of wine per week   Drug use: No   Sexual activity: Yes    Birth control/protection: Post-menopausal  Other Topics Concern   Not on file  Social History Narrative   Not on file   Social Determinants of Health   Financial Resource Strain: Not on file  Food Insecurity: Not on file  Transportation Needs: Not on file  Physical Activity: Not on file  Stress: Not on file  Social Connections: Not on file     Family History: The patient's family history includes Aneurysm in her mother; Atrial fibrillation in her father and another family member; Breast cancer in her cousin and maternal grandmother; CAD in her paternal uncle; Migraines in her sister; Peripheral vascular disease in her mother; Stroke in her mother.  ROS:   Review of Systems  Constitutional:  Positive for weight loss (intentionally lost 10 lbs).  HENT: Negative.    Eyes: Negative.   Respiratory: Negative.    Cardiovascular:  Negative for chest pain, palpitations, orthopnea, claudication, leg swelling and PND.  Gastrointestinal:  Negative for blood in stool.  Genitourinary:  Negative for hematuria.  Musculoskeletal: Negative.   Skin: Negative.   Neurological:  Negative for dizziness, loss of consciousness and headaches.  Psychiatric/Behavioral: Negative.       EKGs/Labs/Other Studies Reviewed:    The following studies were reviewed today:    EKG:  EKG is ordered today.  The ekg ordered today demonstrates NSR, rate of 84 bpm, consistent with previous tracings.   Recent Labs: No results found for requested labs within last 365 days.  Recent Lipid Panel    Component Value Date/Time   CHOL 236 (H) 10/07/2020 0908   TRIG 115 10/07/2020 0908   HDL 66 10/07/2020 0908   CHOLHDL 3.6 10/07/2020  0908   CHOLHDL 3.2 03/29/2015 0823   VLDL 21 03/29/2015 0823   LDLCALC 150 (H) 10/07/2020 0908     Risk Assessment/Calculations:    CHA2DS2-VASc Score =   2  This indicates a  % annual risk of stroke. The patient's score is based upon: CHF History: 0 HTN History: 1 Diabetes History: 0 Stroke History: 0 Age Score: 0 Gender Score: 1                Physical Exam:  VS:  BP 118/80   Pulse 83   Ht _0  (1.626 m)   Wt 205 lb 9.6 oz (93.3 kg)   LMP  (LMP Unknown)   SpO2 98%   BMI 35.29 kg/m     Wt Readings from Last 3 Encounters:  01/08/22 205 lb 9.6 oz (93.3 kg)  10/07/20 205 lb (93 kg)  11/09/19 208 lb 6.4 oz (94.5 kg)     GEN:  Well nourished, well developed in no acute distress HEENT: Normal NECK: No JVD; No carotid bruits LYMPHATICS: No lymphadenopathy CARDIAC: RRR, no murmurs, rubs, gallops RESPIRATORY:  Clear to auscultation without rales, wheezing or rhonchi  ABDOMEN: Soft, non-tender, non-distended MUSCULOSKELETAL:  No edema; No deformity  SKIN: Warm and dry NEUROLOGIC:  Alert and oriented x 3 PSYCHIATRIC:  Normal affect   ASSESSMENT:    1. PAF (paroxysmal atrial fibrillation) ([place])   2. Essential hypertension   3. Long term (current) use of anticoagulants    PLAN:    In order of problems listed above:  PAF with long-term use of anticoagulants - Regular rate and rhythm noted. 12 lead confirmed. She is very astute to when she goes into AF. Has only happened twice that she is aware of, both times last month when she was sick with covid. Continue Eliquis 5 mg twice daily, no indication for dose reduction. She voices questions on if she will need to be on Eliquis indefinitely. Since she has a CHADS-VASc of 2, it would be reasonable to come off. If she is interested in stopping it, this would need to be discussed with her cardiologist and she is agreeable to continue for now in light of her two recent episodes of AF. She denies hematochezia, melena,  hemoptysis. Will check CMET, CBC today. She does feel like her resting HR has been increased, she has had weight loss - although intentional. Will check a TSH today.  HTN - BP well controlled today 118/80. Continue diltiazem 240 mg daily. She checks her BP daily, they are well controlled. HLD - LDL 150 on 10/07/20. PCP will check in January. ASCVD 10 year risk reduction is 2.5%. Calcium score in 2021 0. Medical management is not indicated at this time.            Medication Adjustments/Labs and Tests Ordered: Current medicines are reviewed at length with the patient today.  Concerns regarding medicines are outlined above.  Orders Placed This Encounter  Procedures   CBC   Comp Met (CMET)   TSH   EKG 12-Lead   No orders of the defined types were placed in this encounter.   Patient Instructions  Medication Instructions:  Your physician recommends that you continue on your current medications as directed. Please refer to the Current Medication list given to you today.  *If you need a refill on your cardiac medications before your next appointment, please call your pharmacy*   Lab Work: TODAY:  CMET, TSH, & CBC  If you have labs (blood work) drawn today and your tests are completely normal, you will receive your results only by: [place] (if you have MyChart) OR A paper copy in the mail If you have any lab test that is abnormal or we need to change your treatment, we will call you to review the results.   Testing/Procedures: None ordered   Follow-Up: At [facility], you and your health needs are our priority.  As part of our continuing mission to provide you with exceptional heart  care, we have created designated Provider Care Teams.  These Care Teams include your primary Cardiologist (physician) and Advanced Practice Providers (APPs -  Physician Assistants and Nurse Practitioners) who all work together to provide you with the care you need, when you need  it.  We recommend signing up for the patient portal called "MyChart".  Sign up information is provided on this After Visit Summary.  MyChart is used to connect with patients for Virtual Visits (Telemedicine).  Patients are able to view lab/test results, encounter notes, upcoming appointments, etc.  Non-urgent messages can be sent to your provider as well.   To learn more about what you can do with MyChart, go to [email or web address].    Your next appointment:   1 year(s)  The format for your next appointment:   In Person  Provider:   Freada Bergeron, MD     Other Instructions   Important Information About Sugar         Signed, Trudi Ida, NP  01/08/2022 4:58 PM    [place]

## 2022-01-08 ENCOUNTER — Encounter: Payer: Self-pay | Admitting: Physician Assistant

## 2022-01-08 ENCOUNTER — Ambulatory Visit: Payer: BC Managed Care – PPO | Attending: Physician Assistant | Admitting: Cardiology

## 2022-01-08 VITALS — BP 118/80 | HR 83 | Ht 64.0 in | Wt 205.6 lb

## 2022-01-08 DIAGNOSIS — Z7901 Long term (current) use of anticoagulants: Secondary | ICD-10-CM | POA: Diagnosis not present

## 2022-01-08 DIAGNOSIS — I10 Essential (primary) hypertension: Secondary | ICD-10-CM

## 2022-01-08 DIAGNOSIS — I480 Paroxysmal atrial fibrillation: Secondary | ICD-10-CM | POA: Diagnosis not present

## 2022-01-08 NOTE — Patient Instructions (Signed)
Medication Instructions:  Your physician recommends that you continue on your current medications as directed. Please refer to the Current Medication list given to you today.  *If you need a refill on your cardiac medications before your next appointment, please call your pharmacy*   Lab Work: TODAY:  CMET, TSH, & CBC  If you have labs (blood work) drawn today and your tests are completely normal, you will receive your results only by: Elaine [name] (if you have MyChart) OR A paper copy in the mail If you have any lab test that is abnormal or we need to change your treatment, we will call you to review the results.   Testing/Procedures: None ordered   Follow-Up: At [facility], you and your health needs are our priority.  As part of our continuing mission to provide you with exceptional heart care, we have created designated Provider Care Teams.  These Care Teams include your primary Cardiologist (physician) and Advanced Practice Providers (APPs -  Physician Assistants and Nurse Practitioners) who all work together to provide you with the care you need, when you need it.  We recommend signing up for the patient portal called "MyChart".  Sign up information is provided on this After Visit Summary.  MyChart is used to connect with patients for Virtual Visits (Telemedicine).  Patients are able to view lab/test results, encounter notes, upcoming appointments, etc.  Non-urgent messages can be sent to your provider as well.   To learn more about what you can do with MyChart, go to [email or web address].    Your next appointment:   1 year(s)  The format for your next appointment:   In Person  Provider:   Freada Bergeron, MD     Other Instructions   Important Information About Sugar

## 2022-01-09 ENCOUNTER — Other Ambulatory Visit: Payer: Self-pay

## 2022-01-09 LAB — COMPREHENSIVE METABOLIC PANEL WITH GFR
ALT: 14 IU/L (ref 0–32)
AST: 14 IU/L (ref 0–40)
Albumin/Globulin Ratio: 1.7 (ref 1.2–2.2)
Albumin: 4.5 g/dL (ref 3.8–4.9)
Alkaline Phosphatase: 82 IU/L (ref 44–121)
BUN/Creatinine Ratio: 15 (ref 9–23)
BUN: 15 mg/dL (ref 6–24)
Bilirubin Total: <0.2 mg/dL (ref 0.0–1.2)
CO2: 22 mmol/L (ref 20–29)
Calcium: 9.8 mg/dL (ref 8.7–10.2)
Chloride: 103 mmol/L (ref 96–106)
Creatinine, Ser: 1.03 mg/dL — ABNORMAL HIGH (ref 0.57–1.00)
Globulin, Total: 2.7 g/dL (ref 1.5–4.5)
Glucose: 105 mg/dL — ABNORMAL HIGH (ref 70–99)
Potassium: 4.4 mmol/L (ref 3.5–5.2)
Sodium: 141 mmol/L (ref 134–144)
Total Protein: 7.2 g/dL (ref 6.0–8.5)
eGFR: 64 mL/min/1.73

## 2022-01-09 LAB — TSH: TSH: 1.42 u[IU]/mL (ref 0.450–4.500)

## 2022-01-09 LAB — CBC
Hematocrit: 39.8 % (ref 34.0–46.6)
Hemoglobin: 13.4 g/dL (ref 11.1–15.9)
MCH: 30.6 pg (ref 26.6–33.0)
MCHC: 33.7 g/dL (ref 31.5–35.7)
MCV: 91 fL (ref 79–97)
Platelets: 382 x10E3/uL (ref 150–450)
RBC: 4.38 x10E6/uL (ref 3.77–5.28)
RDW: 13.1 % (ref 11.7–15.4)
WBC: 8.5 x10E3/uL (ref 3.4–10.8)

## 2022-01-09 MED ORDER — DILTIAZEM HCL ER COATED BEADS 240 MG PO CP24: 240.0000 mg | ORAL_CAPSULE | Freq: Every day | ORAL | 3 refills | Status: DC

## 2022-01-31 ENCOUNTER — Ambulatory Visit
Admission: RE | Admit: 2022-01-31 | Discharge: 2022-01-31 | Disposition: A | Discharge status: Home / Self Care | Payer: BC Managed Care – PPO | Source: Ambulatory Visit | Attending: Obstetrics and Gynecology | Admitting: Obstetrics and Gynecology

## 2022-01-31 DIAGNOSIS — Z1231 Encounter for screening mammogram for malignant neoplasm of breast: Secondary | ICD-10-CM

## 2022-03-12 ENCOUNTER — Other Ambulatory Visit: Payer: Self-pay | Admitting: Pharmacist

## 2022-03-12 DIAGNOSIS — I480 Paroxysmal atrial fibrillation: Secondary | ICD-10-CM

## 2022-03-12 MED ORDER — APIXABAN 5 MG PO TABS: 5.0000 mg | ORAL_TABLET | Freq: Two times a day (BID) | ORAL | 1 refills | Status: DC

## 2022-03-12 NOTE — Telephone Encounter (Signed)
Prescription refill request for Eliquis received. Indication: a fib Last office visit: 01/08/22 Scr: 1.03 01/08/22 Age: 57 Weight: 93kg

## 2022-09-17 ENCOUNTER — Telehealth: Payer: Self-pay | Admitting: Cardiology

## 2022-09-17 ENCOUNTER — Other Ambulatory Visit: Payer: Self-pay

## 2022-09-17 DIAGNOSIS — I480 Paroxysmal atrial fibrillation: Secondary | ICD-10-CM

## 2022-09-17 MED ORDER — APIXABAN 5 MG PO TABS: 5.0000 mg | ORAL_TABLET | Freq: Two times a day (BID) | ORAL | 1 refills | Status: DC

## 2022-09-17 NOTE — Telephone Encounter (Signed)
*  STAT* If patient is at the pharmacy, call can be transferred to refill team.   1. Which medications need to be refilled? (please list name of each medication and dose if known) apixaban (ELIQUIS) 5 MG TABS tablet  2. Which pharmacy/location (including street and city if local pharmacy) is medication to be sent to? [id number] [place], [place] - [address]  3. Do they need a 30 day or 90 day supply? 90 day

## 2022-09-17 NOTE — Telephone Encounter (Signed)
Prescription refill request for Eliquis received. Indication: Afib  Last office visit: 01/08/22 Elliot Gurney)  Scr: 1.03 (01/08/22)  Age: 57 Weight: 93.3kg  Appropriate dose. Refill sent.

## 2022-09-17 NOTE — Telephone Encounter (Signed)
Prescription refill request for Eliquis received. Indication:afib Last office visit:12/23 Scr:1.03  12/23 Age: 57 Weight:93.3  kg  Prescription refilled

## 2022-12-28 ENCOUNTER — Other Ambulatory Visit: Payer: Self-pay | Admitting: *Deleted

## 2022-12-28 DIAGNOSIS — Z01419 Encounter for gynecological examination (general) (routine) without abnormal findings: Secondary | ICD-10-CM | POA: Diagnosis not present

## 2022-12-28 MED ORDER — DILTIAZEM HCL ER COATED BEADS 240 MG PO CP24: 240.0000 mg | ORAL_CAPSULE | Freq: Every day | ORAL | 3 refills | Status: DC

## 2023-01-07 ENCOUNTER — Telehealth: Payer: Self-pay | Admitting: Internal Medicine

## 2023-01-07 ENCOUNTER — Telehealth: Payer: Self-pay

## 2023-01-07 ENCOUNTER — Ambulatory Visit: Payer: BC Managed Care – PPO | Admitting: Plastic Surgery

## 2023-01-07 ENCOUNTER — Encounter: Payer: Self-pay | Admitting: Plastic Surgery

## 2023-01-07 ENCOUNTER — Other Ambulatory Visit: Payer: Self-pay | Admitting: Family Medicine

## 2023-01-07 VITALS — BP 159/90 | HR 80 | Ht 64.0 in | Wt 211.0 lb

## 2023-01-07 DIAGNOSIS — Z01818 Encounter for other preprocedural examination: Secondary | ICD-10-CM

## 2023-01-07 DIAGNOSIS — T8543XA Leakage of breast prosthesis and implant, initial encounter: Secondary | ICD-10-CM

## 2023-01-07 DIAGNOSIS — Z1231 Encounter for screening mammogram for malignant neoplasm of breast: Secondary | ICD-10-CM

## 2023-01-07 NOTE — Progress Notes (Signed)
Referring Provider Gweneth Dimitri, MD [address] [place],  [place] [address]   CC:  Chief Complaint  Patient presents with   Advice Only      Elaine [name] is an 57 y.o. female.  HPI: Elaine [name] is a 57 year old female who presents today with complaints of a deflated left breast saline implant.  She would like to have it removed.  She had her implants placed approximately 22 years ago in the submuscular position for aesthetic reasons.  The implant has been deflated for approximately a year and she says it is causing her some discomfort especially in the upper outer quadrant of the left breast.  Additionally the patient has atrial fibrillation or atrial flutter and is currently on Eliquis.  She states she had an mammogram in January which was otherwise normal.  Allergies  Allergen Reactions   Other Other (See Comments)    Raw Almonds, itching   Peaches, itching   Ampicillin Rash    Has taken amoxicillin without any reaction    Outpatient Encounter Medications as of 01/07/2023  Medication Sig   acetaminophen (TYLENOL) 500 MG tablet Take 1,000 mg by mouth every 6 (six) hours as needed for mild pain.    apixaban (ELIQUIS) 5 MG TABS tablet Take 1 tablet (5 mg total) by mouth 2 (two) times daily.   carboxymethylcellulose (REFRESH PLUS) 0.5 % SOLN Place 2 drops into both eyes 3 (three) times daily as needed (dry eyes).    cholecalciferol (VITAMIN D) 1000 UNITS tablet Take 1,000 Units by mouth daily.   diltiazem (CARDIZEM CD) 240 MG 24 hr capsule Take 1 capsule (240 mg total) by mouth daily.   docusate sodium (COLACE) 100 MG capsule Take 100 mg by mouth every other day.   MAGNESIUM PO Take 2 tablets by mouth every evening.   Multiple Vitamins-Minerals (ADULT GUMMY PO) Take 2 tablets by mouth daily.   Tart Cherry 1200 MG CAPS Take by mouth.   Wheat Dextrin (BENEFIBER DRINK MIX PO)    No facility-administered encounter medications on file as of 01/07/2023.     Past Medical  History:  Diagnosis Date   Anemia    Arthritis    hands, ankles, knees   Atrial fibrillation ([facility])    a. Formal dx 12/10/12 - suspected paroxysmal.   Dyspnea    occasion with exertion    GERD (gastroesophageal reflux disease)    diet control   Goiter    labs within normal - MD just watching   HTN (hypertension)    a. Formally dx 11/2012 - mild.   Hypercholesterolemia    Borderline - not requiring medicines   Seasonal allergies    Snoring 12/03/2014   SVD (spontaneous vaginal delivery)    x 2    Past Surgical History:  Procedure Laterality Date   AUGMENTATION MAMMAPLASTY Bilateral    BREAST SURGERY     COLONOSCOPY     polyps   DILATATION & CURETTAGE/HYSTEROSCOPY WITH MYOSURE N/A 11/13/2017   Procedure: DILATATION & CURETTAGE/HYSTEROSCOPY WITH MYOSURE;  Surgeon: Geryl Rankins, MD;  Location: [facility];  Service: Gynecology;  Laterality: N/A;   egg donation     In her 27's.   WISDOM TOOTH EXTRACTION      Family History  Problem Relation Age of Onset   Atrial fibrillation Father    Atrial fibrillation Other        Aunt   Stroke Mother    Aneurysm Mother  Also had blood clot after sister was born   Peripheral vascular disease Mother        In her 84's   CAD Paternal Uncle        CABG age 25   Migraines Sister    Breast cancer Maternal Grandmother    Breast cancer Cousin     Social History   Social History Narrative   Not on file     Review of Systems General: Denies fevers, chills, weight loss CV: Denies chest pain, shortness of breath, palpitations Breast: History of breast implants, no history of breast disease.  Pain in the left breast from the deflated implant  Physical Exam    01/07/2023    8:58 AM 01/08/2022    3:39 PM 10/07/2020    8:19 AM  Vitals with BMI  Height 5\' 4"  5\' 4"  5\' 4"   Weight 211 lbs 205 lbs 10 oz 205 lbs  BMI 36.2 35.27 35.17  Systolic 159 118 956  Diastolic 90 80 78  Pulse 80 83 66    General:  No acute distress,   Alert and oriented, Non-Toxic, Normal speech and affect Breast: Patient has bilateral breast implants in the left breast implant is palpable but deflated.  The right breast implant is also palpable.  The breast is ptotic with the bulk of the breast tissue anterior and inferior to the implant.  Nipples are normal in appearance. Mammogram: Patient states her mammogram was normal in January and she is scheduled for a follow-up mammogram in January 2025.  Will obtain copies of the study. Assessment/Plan Mechanical failure, left breast implant: Patient has a ruptured breast implant on the left.  She would like to have it removed.  We discussed the procedure at length including removing the incision through the previous inframammary scar and removal of the capsule.  The patient is a Engineer, civil (consulting) and has significant understanding of the medical risks associated with surgery on anticoagulation.  She understands especially the risk of postoperative hematoma.  We discussed the procedure including the use of drains.  Her postoperative limitations will include no heavy lifting greater than 20 pounds, no vigorous activity, no submerging the incisions in water for 6 weeks.  I have suggested that she might want to take a week off from work.  All questions were answered to her satisfaction.  Photographs obtained today with her consent.  Will submit her for bilateral breast reduction at her request.  A cardiac clearance and clearance to hold the Eliquis for 3 days before surgery and 3 days after surgery was sent to her cardiologist.  Santiago Glad 01/07/2023, 9:47 AM

## 2023-01-07 NOTE — Telephone Encounter (Signed)
Faxed release of information request to [facility] to retrieve mammogram results. Received fax success confirmation. Forwarded to front desk for batch scanning.

## 2023-01-07 NOTE — Telephone Encounter (Signed)
   Pre-operative Risk Assessment  Last visit: 01/08/2022 Next visit: 01/14/2023 Patient Name: Elaine [name]  DOB: 1965-10-10 MRN: [id number]      Request for Surgical Clearance    Procedure:   BL Removal of breast implants  Date of Surgery:  Clearance TBD                                 Surgeon:  Dr. Weyman Croon Surgeon's Group or Practice Name:  [facility] Phone number:  [phone] Fax number:  [phone]   Type of Clearance Requested:   - Pharmacy:  Hold Apixaban (Eliquis)     Type of Anesthesia:  General    Additional requests/questions:   Are there any medications the patient MUST take the morning of the surgery?  Signed, Royann Shivers   01/07/2023, 4:20 PM

## 2023-01-07 NOTE — Telephone Encounter (Signed)
Faxed surgical clearance form to Dr. Ross/Dr. Devin Going office (cardiologists). Pt stated that Dr. Shari Prows is no longer at that practice and that she sees Dr. Tenny Craw now. Received fax success confirmation. Updated surgical clearance spreadsheet.

## 2023-01-08 DIAGNOSIS — N952 Postmenopausal atrophic vaginitis: Secondary | ICD-10-CM | POA: Insufficient documentation

## 2023-01-08 NOTE — Telephone Encounter (Signed)
I will put the labs in for bmet/cbc.

## 2023-01-08 NOTE — Telephone Encounter (Signed)
Patient with diagnosis of A Fib on Eliquis for anticoagulation.    Procedure:  BL Removal of breast implants   Date of procedure: TBD   CHA2DS2-VASc Score = 2  This indicates a 2.2% annual risk of stroke. The patient's score is based upon: CHF History: 0 HTN History: 1 Diabetes History: 0 Stroke History: 0 Vascular Disease History: 0 Age Score: 0 Gender Score: 1       CrCl overdue Platelet count overdue  Patient needs updated lab work before clearance can be completed  **This guidance is not considered finalized until pre-operative APP has relayed final recommendations.**

## 2023-01-08 NOTE — Telephone Encounter (Signed)
I will forward to Dr. Charlott Rakes nurse Rudene Anda RN as Lorain Childes.

## 2023-01-08 NOTE — Telephone Encounter (Signed)
Preop APP today Elaine Person, NP ok to defer clearance and labs to appt with Dr. Tenny Craw as procedure TBD. I will update the surgeon office as well as the pt has a 1 yr f/u appt and will need labs to be done.

## 2023-01-14 ENCOUNTER — Encounter: Payer: Self-pay | Admitting: Internal Medicine

## 2023-01-14 ENCOUNTER — Ambulatory Visit: Payer: BC Managed Care – PPO | Attending: Internal Medicine | Admitting: Internal Medicine

## 2023-01-14 VITALS — BP 142/88 | HR 90 | Ht 64.0 in | Wt 211.4 lb

## 2023-01-14 DIAGNOSIS — Z01818 Encounter for other preprocedural examination: Secondary | ICD-10-CM

## 2023-01-14 DIAGNOSIS — I480 Paroxysmal atrial fibrillation: Secondary | ICD-10-CM | POA: Diagnosis not present

## 2023-01-14 DIAGNOSIS — I10 Essential (primary) hypertension: Secondary | ICD-10-CM | POA: Diagnosis not present

## 2023-01-14 DIAGNOSIS — Z7901 Long term (current) use of anticoagulants: Secondary | ICD-10-CM

## 2023-01-14 NOTE — Patient Instructions (Addendum)
Medication Instructions:  Your physician recommends that you continue on your current medications as directed. Please refer to the Current Medication list given to you today.  *If you need a refill on your cardiac medications before your next appointment, please call your pharmacy*  Lab Work: TODAY: Lipomed, LP(a), Apo B, Hgb A1c, BMET, CBC, TSH If you have labs (blood work) drawn today and your tests are completely normal, you will receive your results only by: MyChart Message (if you have MyChart) OR A paper copy in the mail If you have any lab test that is abnormal or we need to change your treatment, we will call you to review the results.  Testing/Procedures: None ordered today.  Follow-Up: At [facility], you and your health needs are our priority.  As part of our continuing mission to provide you with exceptional heart care, we have created designated Provider Care Teams.  These Care Teams include your primary Cardiologist (physician) and Advanced Practice Providers (APPs -  Physician Assistants and Nurse Practitioners) who all work together to provide you with the care you need, when you need it.  Your next appointment:   1 year(s)  The format for your next appointment:   In Person  Provider:   Dietrich Pates, MD {

## 2023-01-14 NOTE — Progress Notes (Signed)
Cardiology Office Note:    Date:  01/14/2023   ID:  Elaine [name], DOB 1965-11-21, MRN [id number]  PCP:  Gweneth Dimitri, MD  No chief complaint on file.   History of Present Illness:    Elaine [name] is a 57 y.o. 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 Formal dx 12/10/12 - suspected paroxysmal.   Dyspnea    occasion with exertion    GERD (gastroesophageal reflux disease)    diet control   Goiter    labs within normal - MD just watching   HTN (hypertension)    a. Formally dx 11/2012 - mild.   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   WISDOM TOOTH EXTRACTION      Current Medications: Current Meds  Medication Sig   acetaminophen (TYLENOL) 500 MG tablet Take 1,000 mg by mouth every 6 (six) hours as needed for mild pain.    apixaban  (ELIQUIS) 5 MG TABS tablet Take 1 tablet (5 mg total) by mouth 2 (two) times daily.   carboxymethylcellulose (REFRESH PLUS) 0.5 % SOLN Place 2 drops into both eyes 3 (three) times daily as needed (dry eyes).    cholecalciferol (VITAMIN D) 1000 UNITS tablet Take 1,000 Units by mouth daily.   diltiazem (CARDIZEM CD) 240 MG 24 hr capsule Take 1 capsule (240 mg total) by mouth daily.   docusate sodium (COLACE) 100 MG capsule Take 100 mg by mouth every other day.   MAGNESIUM PO Take 2 tablets by mouth every evening.   Multiple Vitamins-Minerals (ADULT GUMMY PO) Take 2 tablets by mouth daily.   Tart Cherry 1200 MG CAPS Take by mouth.   Wheat Dextrin (BENEFIBER DRINK MIX PO)      Allergies:   Other and Ampicillin   Social History   Socioeconomic History   Marital status: Married    Spouse name: Not on file   Number of children: Not on file  Years of education: Not on file   Highest education level: Not on file  Occupational History    Employer: [employer]    Comment: Nurse  Tobacco Use   Smoking status: Never   Smokeless tobacco: Never   Tobacco comments:    Secondhand smoke up to age 67  Vaping Use   Vaping status: Never Used  Substance and Sexual Activity   Alcohol use: Yes    Alcohol/week: 1.0 standard drink of alcohol    Types: 1 Glasses of wine per week   Drug use: No   Sexual activity: Yes    Birth control/protection: Post-menopausal  Other Topics Concern   Not on file  Social History Narrative   Not on file   Social Drivers of Health   Financial Resource Strain: Not on file  Food Insecurity: Not on file  Transportation Needs: Not on file  Physical Activity: Not on file  Stress: Not on file  Social Connections: Not on file     Family History: The patient's family history includes Aneurysm in her mother; Atrial fibrillation in her father and another family member; Breast cancer in her cousin and maternal grandmother; CAD in her paternal uncle;  Migraines in her sister; Peripheral vascular disease in her mother; Stroke in her mother.    EKG:  EKG is ordered today.  NSR   Septal MI  Recent Labs: No results found for requested labs within last 365 days.  Recent Lipid Panel    Component Value Date/Time   CHOL 236 (H) 10/07/2020 0908   TRIG 115 10/07/2020 0908   HDL 66 10/07/2020 0908   CHOLHDL 3.6 10/07/2020 0908   CHOLHDL 3.2 03/29/2015 0823   VLDL 21 03/29/2015 0823   LDLCALC 150 (H) 10/07/2020 0908     Risk Assessment/Calculations:    CHA2DS2-VASc Score = 2 2  This indicates a 2.2% annual risk of stroke. The patient's score is based upon: CHF History: 0 HTN History: 1 Diabetes History: 0 Stroke History: 0 Vascular Disease History: 0 Age Score: 0 Gender Score: 1          Physical Exam:    VS:  BP (!) 142/88   Pulse 90   Ht 5\' 4"  (1.626 m)   Wt 211 lb 6.4 oz (95.9 kg)   LMP  (LMP Unknown)   SpO2 98%   BMI 36.29 kg/m     Wt Readings from Last 3 Encounters:  01/14/23 211 lb 6.4 oz (95.9 kg)  01/07/23 211 lb (95.7 kg)  01/08/22 205 lb 9.6 oz (93.3 kg)     GEN:  Well nourished, well developed in no acute distress HEENT: Normal NECK: No JVD; No carotid bruits CARDIAC: RRR, no murmurs RESPIRATORY:  Clear to auscultation ABDOMEN: Soft, non-tender, no masses MUSCULOSKELETAL:  No edema; No deformity  SKIN: Warm and dry NEUROLOGIC:  Alert and oriented x 3 PSYCHIATRIC:  Normal affect   ASSESSMENT:    1. Essential hypertension    PLAN:     1  PAF   PT with only occasional episodes of afib    Self limited   Last spell when had COVID   CHADSVASc score is 2   Keep on Elius     2  HTN   BP is a little high here    IT is usually lower    Follow   Keep on current regimen      Will check BMET, CBC< TSH, A1C, lipomed, Lpa and Apo B  Stay/get active     Diet:   Whole, minimally processed foods   Avoid carbs/sugars   Medication Adjustments/Labs and Tests Ordered: Current medicines are reviewed at  length with the patient today.  Concerns regarding medicines are outlined above.  Orders Placed This Encounter  Procedures   EKG 12-Lead   No orders of the defined types were placed in this encounter.   There are no Patient Instructions on file for this visit.   Signed, Dietrich Pates, MD  01/14/2023 4:12 PM    [facility]

## 2023-01-17 LAB — BASIC METABOLIC PANEL
BUN/Creatinine Ratio: 15 (ref 9–23)
BUN: 14 mg/dL (ref 6–24)
CO2: 24 mmol/L (ref 20–29)
Calcium: 9.4 mg/dL (ref 8.7–10.2)
Chloride: 102 mmol/L (ref 96–106)
Creatinine, Ser: 0.96 mg/dL (ref 0.57–1.00)
Glucose: 89 mg/dL (ref 70–99)
Potassium: 4.4 mmol/L (ref 3.5–5.2)
Sodium: 140 mmol/L (ref 134–144)
eGFR: 69 mL/min/{1.73_m2} (ref >59)

## 2023-01-17 LAB — CBC
Hematocrit: 41.1 % (ref 34.0–46.6)
Hemoglobin: 13.6 g/dL (ref 11.1–15.9)
MCH: 30.4 pg (ref 26.6–33.0)
MCHC: 33.1 g/dL (ref 31.5–35.7)
MCV: 92 fL (ref 79–97)
Platelets: 428 10*3/uL (ref 150–450)
RBC: 4.47 x10E6/uL (ref 3.77–5.28)
RDW: 12.7 % (ref 11.7–15.4)
WBC: 10.3 10*3/uL (ref 3.4–10.8)

## 2023-01-17 LAB — NMR, LIPOPROFILE
Cholesterol, Total: 267 mg/dL — ABNORMAL HIGH (ref 100–199)
HDL Particle Number: 44.5 umol/L (ref >=30.5)
HDL-C: 60 mg/dL (ref >39)
LDL Particle Number: 1925 nmol/L — ABNORMAL HIGH (ref <1000)
LDL Size: 21.3 nmol (ref >20.5)
LDL-C (NIH Calc): 170 mg/dL — ABNORMAL HIGH (ref 0–99)
LP-IR Score: 54 — ABNORMAL HIGH (ref <=45)
Small LDL Particle Number: 809 nmol/L — ABNORMAL HIGH (ref <=527)
Triglycerides: 202 mg/dL — ABNORMAL HIGH (ref 0–149)

## 2023-01-17 LAB — TSH: TSH: 1.5 u[IU]/mL (ref 0.450–4.500)

## 2023-01-17 LAB — LIPOPROTEIN A (LPA): Lipoprotein (a): 19.2 nmol/L (ref <75.0)

## 2023-01-17 LAB — HEMOGLOBIN A1C
Est. average glucose Bld gHb Est-mCnc: 120 mg/dL
Hgb A1c MFr Bld: 5.8 % — ABNORMAL HIGH (ref 4.8–5.6)

## 2023-01-17 LAB — APOLIPOPROTEIN B: Apolipoprotein B: 131 mg/dL — ABNORMAL HIGH (ref <90)

## 2023-02-08 ENCOUNTER — Ambulatory Visit
Admission: RE | Admit: 2023-02-08 | Discharge: 2023-02-08 | Disposition: A | Discharge status: Home / Self Care | Payer: BC Managed Care – PPO | Source: Ambulatory Visit | Attending: Family Medicine | Admitting: Family Medicine

## 2023-02-08 ENCOUNTER — Telehealth: Payer: Self-pay | Admitting: Internal Medicine

## 2023-02-08 DIAGNOSIS — Z1231 Encounter for screening mammogram for malignant neoplasm of breast: Secondary | ICD-10-CM | POA: Diagnosis not present

## 2023-02-08 NOTE — Telephone Encounter (Signed)
Spoke with patient and Dr. Tenny Craw message is for her to return in September for labs. She will come get labs in September

## 2023-02-08 NOTE — Telephone Encounter (Signed)
Pt states that she was told to call office to schedule appt for aug but recall states 1 yr please call to discuss

## 2023-06-13 ENCOUNTER — Telehealth: Payer: Self-pay | Admitting: Internal Medicine

## 2023-06-13 DIAGNOSIS — I480 Paroxysmal atrial fibrillation: Secondary | ICD-10-CM

## 2023-06-13 MED ORDER — APIXABAN 5 MG PO TABS
5.0000 mg | ORAL_TABLET | Freq: Two times a day (BID) | ORAL | 1 refills | Status: AC
Start: 2023-06-13 — End: ?

## 2023-06-13 NOTE — Telephone Encounter (Signed)
*  STAT* If patient is at the pharmacy, call can be transferred to refill team.   1. Which medications need to be refilled? (please list name of each medication and dose if known)   apixaban  (ELIQUIS ) 5 MG TABS tablet   2. Would you like to learn more about the convenience, safety, & potential cost savings by using the [facility]?   3. Are you open to using the [facility] (Type [facility]. ).  4. Which pharmacy/location (including street and city if local pharmacy) is medication to be sent to?  [id number] - [place],  - [address]   5. Do they need a 30 day or 90 day supply?   90 day  Patient stated she only has 2 tablets left.

## 2023-07-02 DIAGNOSIS — Z23 Encounter for immunization: Secondary | ICD-10-CM | POA: Diagnosis not present

## 2023-07-02 DIAGNOSIS — E669 Obesity, unspecified: Secondary | ICD-10-CM | POA: Diagnosis not present

## 2023-07-02 DIAGNOSIS — R7303 Prediabetes: Secondary | ICD-10-CM | POA: Diagnosis not present

## 2023-07-02 DIAGNOSIS — Z79899 Other long term (current) drug therapy: Secondary | ICD-10-CM | POA: Diagnosis not present

## 2023-07-02 DIAGNOSIS — E7800 Pure hypercholesterolemia, unspecified: Secondary | ICD-10-CM | POA: Diagnosis not present

## 2023-07-02 DIAGNOSIS — Z0000 Encounter for general adult medical examination without abnormal findings: Secondary | ICD-10-CM | POA: Diagnosis not present

## 2023-07-02 DIAGNOSIS — I480 Paroxysmal atrial fibrillation: Secondary | ICD-10-CM | POA: Diagnosis not present

## 2023-07-05 ENCOUNTER — Ambulatory Visit: Payer: Self-pay | Admitting: Internal Medicine

## 2023-08-01 ENCOUNTER — Telehealth: Payer: Self-pay | Admitting: Internal Medicine

## 2023-08-01 DIAGNOSIS — I480 Paroxysmal atrial fibrillation: Secondary | ICD-10-CM

## 2023-08-01 DIAGNOSIS — Z7901 Long term (current) use of anticoagulants: Secondary | ICD-10-CM

## 2023-08-01 DIAGNOSIS — I119 Hypertensive heart disease without heart failure: Secondary | ICD-10-CM

## 2023-08-01 DIAGNOSIS — I10 Essential (primary) hypertension: Secondary | ICD-10-CM

## 2023-08-01 DIAGNOSIS — Z5181 Encounter for therapeutic drug level monitoring: Secondary | ICD-10-CM

## 2023-08-01 NOTE — Telephone Encounter (Signed)
 Pt c/o medication issue:  1. Name of Medication: diltiazem  (CARDIZEM  CD) 240 MG 24 hr capsule   2. How are you currently taking this medication (dosage and times per day)? As written   3. Are you having a reaction (difficulty breathing--STAT)? No   4. What is your medication issue? Pt states she starting to get dizzy when standing up and the medication makes her sick to her stomach sometimes. She would like to go back to taking 180 MG

## 2023-08-01 NOTE — Telephone Encounter (Signed)
 Would drop diltiazem  to 180 mg daily Labs:  CBC, CMET, lipomed, TSH and A1C

## 2023-08-01 NOTE — Telephone Encounter (Signed)
 Called patient back about her message. Patient stated she is down 32 lbs, and now her weight is 179 lbs. Patient complaining of dizziness when standing and feeling sick to her stomach at times. Patient states she has not had any A. FIB after she lost her first 10 lbs. Patient's current BPs are 113/72 and 105/64, resting HR 60's. Patient thinks she might do better on a lower dose of diltiazem . Patient is currently on diltiazem  240 mg daily. Will send message to Dr. Okey for advisement. Will also find out what labs Dr. Okey wants in September, so orders can be placed.

## 2023-08-02 MED ORDER — DILTIAZEM HCL ER COATED BEADS 180 MG PO CP24
180.0000 mg | ORAL_CAPSULE | Freq: Every day | ORAL | 3 refills | Status: AC
Start: 2023-08-02 — End: 2023-10-31

## 2023-08-02 NOTE — Telephone Encounter (Signed)
 Spoke with the patient and reviewed recommendations from Dr. Okey. Prescription has been sent in and labs have been ordered.

## 2023-11-29 LAB — CBC

## 2023-12-01 ENCOUNTER — Ambulatory Visit: Payer: Self-pay | Admitting: Internal Medicine

## 2023-12-01 LAB — NMR, LIPOPROFILE
Cholesterol, Total: 244 mg/dL — AB (ref 100–199)
HDL Particle Number: 41.8 umol/L (ref >=30.5)
HDL-C: 73 mg/dL (ref >39)
LDL Particle Number: 1751 nmol/L — AB (ref <1000)
LDL Size: 21.5 nm (ref >20.5)
LDL-C (NIH Calc): 159 mg/dL — AB (ref 0–99)
LP-IR Score: 29 (ref <=45)
Small LDL Particle Number: 708 nmol/L — AB (ref <=527)
Triglycerides: 71 mg/dL (ref 0–149)

## 2023-12-01 LAB — COMPREHENSIVE METABOLIC PANEL WITH GFR
ALT: 13 IU/L (ref 0–32)
AST: 11 IU/L (ref 0–40)
Albumin: 4.6 g/dL (ref 3.8–4.9)
Alkaline Phosphatase: 84 IU/L (ref 49–135)
BUN/Creatinine Ratio: 20 (ref 9–23)
BUN: 19 mg/dL (ref 6–24)
Bilirubin Total: 0.4 mg/dL (ref 0.0–1.2)
CO2: 22 mmol/L (ref 20–29)
Calcium: 9.6 mg/dL (ref 8.7–10.2)
Chloride: 101 mmol/L (ref 96–106)
Creatinine, Ser: 0.95 mg/dL (ref 0.57–1.00)
Globulin, Total: 2.6 g/dL (ref 1.5–4.5)
Glucose: 94 mg/dL (ref 70–99)
Potassium: 4.7 mmol/L (ref 3.5–5.2)
Sodium: 139 mmol/L (ref 134–144)
Total Protein: 7.2 g/dL (ref 6.0–8.5)
eGFR: 69 mL/min/1.73 (ref >59)

## 2023-12-01 LAB — CBC
Hematocrit: 43.4 % (ref 34.0–46.6)
Hemoglobin: 14.1 g/dL (ref 11.1–15.9)
MCH: 30.3 pg (ref 26.6–33.0)
MCHC: 32.5 g/dL (ref 31.5–35.7)
MCV: 93 fL (ref 79–97)
Platelets: 360 x10E3/uL (ref 150–450)
RBC: 4.66 x10E6/uL (ref 3.77–5.28)
RDW: 12.4 % (ref 11.7–15.4)
WBC: 7.5 x10E3/uL (ref 3.4–10.8)

## 2023-12-01 LAB — TSH: TSH: 1.09 u[IU]/mL (ref 0.450–4.500)

## 2023-12-01 LAB — HEMOGLOBIN A1C
Est. average glucose Bld gHb Est-mCnc: 117 mg/dL
Hgb A1c MFr Bld: 5.7 % — ABNORMAL HIGH (ref 4.8–5.6)

## 2023-12-27 ENCOUNTER — Other Ambulatory Visit: Payer: Self-pay | Admitting: Nurse Practitioner

## 2023-12-27 DIAGNOSIS — Z1231 Encounter for screening mammogram for malignant neoplasm of breast: Secondary | ICD-10-CM

## 2024-01-02 ENCOUNTER — Other Ambulatory Visit: Payer: Self-pay | Admitting: Nurse Practitioner

## 2024-01-02 DIAGNOSIS — T8541XA Breakdown (mechanical) of breast prosthesis and implant, initial encounter: Secondary | ICD-10-CM

## 2024-01-02 DIAGNOSIS — Z1231 Encounter for screening mammogram for malignant neoplasm of breast: Secondary | ICD-10-CM

## 2024-01-06 NOTE — Progress Notes (Unsigned)
 Cardiology Office Note    Date:  01/09/2024  ID:  Elaine [name], DOB [birth year], MRN [id number] PCP:  Aisha Harvey, MD  Cardiologist:  Vina Gull, MD  Electrophysiologist:  None   Chief Complaint: f/u atrial fibrillation  History of Present Illness: .    Elaine [name] (pronounced Zoo-ker) is a 58 y.o. female with visit-pertinent history of PAF, anemia, HTN, HLD, pre-DM, obesity, CAC 0 in 2021, migraines seen for follow-up. 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 I met her in 2014 when she was remotely diagnosed with PAF. She saw Dr. Dominick before transitioning to Dr. Gull. She has had episodic breakthrough episodes since original diagnosis. 2D echo 2014 showed EF 60-65%, no RWMA, no significant findings. ETT 03/2019 was low risk with CAC 0 at that time. Sleep study 2016 showed no OSA.  She returns for follow-up overall doing well. She had some breakthrough AF HR 120s-130s in January/February. In November she felt a different type of palpitation with [name] showing atrial bigeminy, with one PVC. This lasted about 10 minutes before resolving. She has been working on diet/exercise and has lost 25 lbs. She also notes her acid reflux resolved with weight loss. No angina, dyspnea, syncope. She notes a very strong family history of atrial fibrillation and stroke. Her father also required a pacemaker around his 44s. Reports home SBPs 1teens/70s (actually had to reduce diltiazem  earlier this year due to soft BP).  Labwork independently reviewed: 11/2023 CMET OK K 4.7, Cr 0.95, CBC wnl, LDL 159, trig 71, A1c 5.7, TSH wnl 12/2022 TSH OK  ROS: .    Please see the history of present illness.  All other systems are reviewed and otherwise negative.  Studies Reviewed: SABRA    EKG:  EKG is ordered today, personally reviewed, demonstrating  EKG Interpretation Date/Time:  Thursday January 09 2024 09:23:48 EST Ventricular Rate:  67 PR Interval:  176 QRS Duration:  86 QT Interval:  364 QTC  Calculation: 384 R Axis:   25  Text Interpretation: Normal sinus rhythm Low voltage QRS No acute changes Confirmed by Vail Basista [id number]) on 01/09/2024 9:47:18 AM    CV Studies: Cardiac studies reviewed are outlined and summarized above. Otherwise please see EMR for full report.   Current Reported Medications:.    Prior to Admission medications  Medication Sig Start Date End Date Taking? Authorizing Provider  acetaminophen  (TYLENOL ) 500 MG tablet Take 1,000 mg by mouth every 6 (six) hours as needed for mild pain.    Yes [provider]  apixaban  (ELIQUIS ) 5 MG TABS tablet Take 1 tablet (5 mg total) by mouth 2 (two) times daily. 06/13/23  Yes Gull Vina GAILS, MD  carboxymethylcellulose (REFRESH PLUS) 0.5 % SOLN Place 2 drops into both eyes 3 (three) times daily as needed (dry eyes).    Yes [provider]  cholecalciferol (VITAMIN D) 1000 UNITS tablet Take 1,000 Units by mouth daily.   Yes [provider]  diltiazem  (CARDIZEM  CD) 180 MG 24 hr capsule Take 1 capsule (180 mg total) by mouth daily. 08/02/23 01/09/24 Yes Gull Vina GAILS, MD  docusate sodium (COLACE) 100 MG capsule Take 100 mg by mouth every other day.   Yes [provider]  MAGNESIUM PO Take 2 tablets by mouth every evening.   Yes [provider]  Multiple Vitamins-Minerals (ADULT GUMMY PO) Take 2 tablets by mouth daily.   Yes [provider]  Tart Cherry 1200 MG CAPS Take by mouth.   Yes [provider]  Wheat Dextrin (BENEFIBER DRINK MIX PO)  07/21/20  Yes [provider]     Physical Exam:    VS:  BP (!) 138/90 (BP Location: Left Arm, Patient Position: Sitting, Cuff Size: Normal)   Pulse 67   Ht 5' 4 (1.626 m)   Wt 186 lb (84.4 kg)   LMP  (LMP Unknown)   BMI 31.93 kg/m    Wt Readings from Last 3 Encounters:  01/09/24 186 lb (84.4 kg)  01/14/23 211 lb 6.4 oz (95.9 kg)  01/07/23 211 lb (95.7 kg)    GEN: Well nourished, well developed in no acute  distress NECK: No JVD; No carotid bruits CARDIAC: RRR, no murmurs, rubs, gallops RESPIRATORY:  Clear to auscultation without rales, wheezing or rhonchi  ABDOMEN: Soft, non-tender, non-distended EXTREMITIES:  No edema; No acute deformity   Asessement and Plan:.    1. Paroxysmal atrial fibrillation, also episodic PACs, rare PVCs - it remains somewhat unclear why she developed atrial fibrillation from a fairly young age in her 35s. She has not had extreme obesity, sleep apnea, or any dramatic triggers. Interestingly her EKG continues to show consistent low voltage even with weight loss. She also has a very strong family history of atrial fibrillation and strokes, as well as conduction disease in her father. It's been over 10 years since last echo. We discussed proceeding with a baseline cardiac MRI to exclude infiltrative vs arrhythmogenic cardiomyopathy given her episodic atrial arrhythmias, family history and low voltage. We will update H/H, BMET, Mg today. Overall she is satisfied with the control she has of her arrhythmias with diltiazem  240mg  daily. We discussed referral to EP to consider ablation. She follows an online support group for atrial fibrillation and has been somewhat hesitant of ablation due to stories of patients needing repeat procedures. She indicated she would consider Watchman; we discussed that this would impact anticoagulation plan but does not alter the occurrences of atrial fibrillation themselves. Since she is tolerating anticoagulation and has no other major contraindications to this at this time, I do not think she would presently be a Watchman candidate. She will let me know if she decides she wants to meet with EP. Otherwise we will refill Eliquis  5mg  BID today and continue diltiazem  240mg  once daily.  2. Essential HTN - BP suboptimal today but patient reports this is very much an outlier, home readings have all been controlled. She will continue to keep an eye on it and notify  if she develops a tendency to run higher than 130.  3. Hyperlipidemia - noted on last lipid panel 11/2023. Dr. Okey recommended follow-up in 6 months. Will order FLP for that time.  4. Pre-diabetes - will defer recheck to PCP in follow-up. I suspect this will be improved given her weight loss and dietary changes.    Disposition: F/u with Dr. Okey or myself in 1 year, sooner if baseline cMRI abnormal.  Signed, Raphael LOISE Bring, PA-C

## 2024-01-09 ENCOUNTER — Other Ambulatory Visit: Payer: Self-pay | Admitting: Internal Medicine

## 2024-01-09 ENCOUNTER — Encounter: Payer: Self-pay | Admitting: Physician Assistant

## 2024-01-09 ENCOUNTER — Ambulatory Visit: Attending: Cardiovascular Disease | Admitting: Physician Assistant

## 2024-01-09 VITALS — BP 138/90 | HR 67 | Ht 64.0 in | Wt 186.0 lb

## 2024-01-09 DIAGNOSIS — I480 Paroxysmal atrial fibrillation: Secondary | ICD-10-CM | POA: Diagnosis not present

## 2024-01-09 DIAGNOSIS — I491 Atrial premature depolarization: Secondary | ICD-10-CM | POA: Diagnosis not present

## 2024-01-09 DIAGNOSIS — E785 Hyperlipidemia, unspecified: Secondary | ICD-10-CM

## 2024-01-09 DIAGNOSIS — R9431 Abnormal electrocardiogram [ECG] [EKG]: Secondary | ICD-10-CM | POA: Diagnosis not present

## 2024-01-09 DIAGNOSIS — I493 Ventricular premature depolarization: Secondary | ICD-10-CM

## 2024-01-09 DIAGNOSIS — I10 Essential (primary) hypertension: Secondary | ICD-10-CM

## 2024-01-09 DIAGNOSIS — R7303 Prediabetes: Secondary | ICD-10-CM

## 2024-01-09 LAB — HEMOGLOBIN AND HEMATOCRIT, BLOOD

## 2024-01-09 MED ORDER — APIXABAN 5 MG PO TABS: 5.0000 mg | ORAL_TABLET | Freq: Two times a day (BID) | ORAL | 3 refills | Status: AC

## 2024-01-09 NOTE — Patient Instructions (Signed)
 Medication Instructions:  NO CHANGES  Lab Work: BMET, MAGNESIUM, AND HEMOGLOBIN/HEMATOCRIT TO BE DONE TODAY. FASTING LIPID PANEL TO BE DONE MAY 2026  Testing/Procedures: Your physician has requested that you have a cardiac MRI. Cardiac MRI uses a computer to create images of your heart as its beating, producing both still and moving pictures of your heart and major blood vessels. For further information please visit [email or web address]. Please follow the instruction sheet given to you today for more information.   Follow-Up: At [facility], you and your health needs are our priority.  As part of our continuing mission to provide you with exceptional heart care, our providers are all part of one team.  This team includes your primary Cardiologist (physician) and Advanced Practice Providers or APPs (Physician Assistants and Nurse Practitioners) who all work together to provide you with the care you need, when you need it.  Your next appointment:   1 YEAR  Provider:   Vina Gull, MD OR Dayna Dunn, PA-C

## 2024-01-09 NOTE — Telephone Encounter (Signed)
 Refill request

## 2024-01-10 ENCOUNTER — Ambulatory Visit: Payer: Self-pay | Admitting: Physician Assistant

## 2024-01-10 LAB — HEMOGLOBIN AND HEMATOCRIT, BLOOD
Hematocrit: 42.5 % (ref 34.0–46.6)
Hemoglobin: 13.8 g/dL (ref 11.1–15.9)

## 2024-01-10 LAB — BASIC METABOLIC PANEL WITH GFR
BUN/Creatinine Ratio: 19 (ref 9–23)
BUN: 19 mg/dL (ref 6–24)
CO2: 23 mmol/L (ref 20–29)
Calcium: 9.8 mg/dL (ref 8.7–10.2)
Chloride: 101 mmol/L (ref 96–106)
Creatinine, Ser: 1 mg/dL (ref 0.57–1.00)
Glucose: 86 mg/dL (ref 70–99)
Potassium: 4.8 mmol/L (ref 3.5–5.2)
Sodium: 139 mmol/L (ref 134–144)
eGFR: 65 mL/min/1.73 (ref >59)

## 2024-01-10 LAB — MAGNESIUM: Magnesium: 2 mg/dL (ref 1.6–2.3)

## 2024-01-24 ENCOUNTER — Encounter (HOSPITAL_COMMUNITY): Payer: Self-pay

## 2024-01-28 ENCOUNTER — Ambulatory Visit (HOSPITAL_COMMUNITY)
Admission: RE | Admit: 2024-01-28 | Discharge: 2024-01-28 | Disposition: A | Discharge status: Home / Self Care | Source: Ambulatory Visit | Attending: Physician Assistant | Admitting: Physician Assistant

## 2024-01-28 DIAGNOSIS — E785 Hyperlipidemia, unspecified: Secondary | ICD-10-CM | POA: Insufficient documentation

## 2024-01-28 DIAGNOSIS — R9431 Abnormal electrocardiogram [ECG] [EKG]: Secondary | ICD-10-CM | POA: Insufficient documentation

## 2024-01-28 DIAGNOSIS — I493 Ventricular premature depolarization: Secondary | ICD-10-CM

## 2024-01-28 DIAGNOSIS — I10 Essential (primary) hypertension: Secondary | ICD-10-CM | POA: Insufficient documentation

## 2024-01-28 DIAGNOSIS — I480 Paroxysmal atrial fibrillation: Secondary | ICD-10-CM | POA: Insufficient documentation

## 2024-01-28 DIAGNOSIS — R7303 Prediabetes: Secondary | ICD-10-CM | POA: Insufficient documentation

## 2024-01-28 DIAGNOSIS — I491 Atrial premature depolarization: Secondary | ICD-10-CM

## 2024-01-28 MED ORDER — GADOBUTROL 1 MMOL/ML IV SOLN
10.0000 mL | Freq: Once | INTRAVENOUS | Status: AC | PRN
  Administered 2024-01-28: 10 mL via INTRAVENOUS

## 2024-01-31 NOTE — Telephone Encounter (Signed)
 Please place order for Zio and EP referral. Thank you

## 2024-02-03 ENCOUNTER — Telehealth: Payer: Self-pay

## 2024-02-03 ENCOUNTER — Ambulatory Visit: Attending: Physician Assistant

## 2024-02-03 DIAGNOSIS — I491 Atrial premature depolarization: Secondary | ICD-10-CM

## 2024-02-03 NOTE — Progress Notes (Unsigned)
Enrolled for [employer] to mail a ZIO XT long term holter monitor to the patients address on file.   Dr. Ross to read. 

## 2024-02-03 NOTE — Telephone Encounter (Signed)
-----   Message -----  From: Abigail Raphael SAILOR, PA-C  Sent: 01/31/2024  11:39 AM EST  To: Shona LELON Pouch; Shelly A Wells; Andrez PARAS J*  Subject: RE: What procedure code for ordered monitor    Hi Shona, it looks like Andrez fielded the reply from the patient wanting to move forward with the monitor, I will cc her here to help place the order (7 day non live Zio under dx PACs). The monitor was discussed during 01/10/24 results follow-up communication with the patient as she has begun having more PACs recently. I will also cc to [facility] as FYI where to find this as well. Thanks all.  Dayna    Will place order for monitor.

## 2024-02-03 NOTE — Telephone Encounter (Signed)
 Received staff message on Friday that billing was unable to process estimate without formal order. Elaine [name] has placed Zio order. I also sent her message to ensure EP referral also placed. Will route FYI back to billing team to run estimate and let patient know.

## 2024-02-03 NOTE — Telephone Encounter (Signed)
 Forwarded to Dayna Dunn in other fpl group

## 2024-02-04 NOTE — Addendum Note (Signed)
 Addended by: DRENA MARTINIS, Betsi Crespi L on: 02/04/2024 09:39 AM   Modules accepted: Orders

## 2024-02-05 ENCOUNTER — Other Ambulatory Visit: Payer: Self-pay | Admitting: Medical Genetics

## 2024-02-12 ENCOUNTER — Ambulatory Visit

## 2024-02-24 ENCOUNTER — Ambulatory Visit

## 2024-03-10 ENCOUNTER — Ambulatory Visit

## 2024-03-17 ENCOUNTER — Other Ambulatory Visit: Payer: Self-pay
